# Patient Record
Sex: Male | Born: 1970 | Race: White | Hispanic: No | Marital: Married | Smoking: Former smoker
Health system: Southern US, Community
[De-identification: ages and names within clinical notes are randomized; demographics above are authoritative.]

## PROBLEM LIST (undated history)

## (undated) DIAGNOSIS — B351 Tinea unguium: Secondary | ICD-10-CM

## (undated) DIAGNOSIS — G4730 Sleep apnea, unspecified: Secondary | ICD-10-CM

## (undated) DIAGNOSIS — I10 Essential (primary) hypertension: Secondary | ICD-10-CM

## (undated) DIAGNOSIS — E785 Hyperlipidemia, unspecified: Secondary | ICD-10-CM

## (undated) DIAGNOSIS — H409 Unspecified glaucoma: Secondary | ICD-10-CM

## (undated) HISTORY — PX: INNER EAR SURGERY: SHX679

## (undated) HISTORY — PX: HERNIA REPAIR: SHX51

## (undated) HISTORY — DX: Essential (primary) hypertension: I10

## (undated) HISTORY — DX: Tinea unguium: B35.1

## (undated) HISTORY — PX: ARTHROSCOPIC REPAIR ACL: SUR80

## (undated) HISTORY — DX: Unspecified glaucoma: H40.9

## (undated) HISTORY — DX: Sleep apnea, unspecified: G47.30

## (undated) HISTORY — DX: Hyperlipidemia, unspecified: E78.5

---

## 2011-02-10 ENCOUNTER — Emergency Department (HOSPITAL_COMMUNITY)
Admission: EM | Admit: 2011-02-10 | Discharge: 2011-02-10 | Disposition: A | Discharge status: Home / Self Care | Payer: BC Managed Care – PPO | Attending: Emergency Medicine | Admitting: Emergency Medicine

## 2011-02-10 ENCOUNTER — Emergency Department (HOSPITAL_COMMUNITY): Payer: BC Managed Care – PPO

## 2011-02-10 DIAGNOSIS — R030 Elevated blood-pressure reading, without diagnosis of hypertension: Secondary | ICD-10-CM | POA: Insufficient documentation

## 2011-02-10 DIAGNOSIS — R109 Unspecified abdominal pain: Secondary | ICD-10-CM | POA: Insufficient documentation

## 2011-02-10 DIAGNOSIS — R112 Nausea with vomiting, unspecified: Secondary | ICD-10-CM | POA: Insufficient documentation

## 2011-02-10 DIAGNOSIS — N201 Calculus of ureter: Secondary | ICD-10-CM | POA: Insufficient documentation

## 2011-02-10 LAB — URINE MICROSCOPIC-ADD ON

## 2011-02-10 LAB — URINALYSIS, ROUTINE W REFLEX MICROSCOPIC
Glucose, UA: NEGATIVE mg/dL
Ketones, ur: NEGATIVE mg/dL
Nitrite: NEGATIVE
Specific Gravity, Urine: 1.026 (ref 1.005–1.030)
pH: 7.5 (ref 5.0–8.0)

## 2011-02-10 LAB — POCT I-STAT, CHEM 8
Calcium, Ion: 1.1 mmol/L — ABNORMAL LOW (ref 1.12–1.32)
Chloride: 108 mEq/L (ref 96–112)
Glucose, Bld: 102 mg/dL — ABNORMAL HIGH (ref 70–99)
HCT: 42 % (ref 39.0–52.0)
Hemoglobin: 14.3 g/dL (ref 13.0–17.0)
TCO2: 22 mmol/L (ref 0–100)

## 2011-02-12 ENCOUNTER — Ambulatory Visit (HOSPITAL_COMMUNITY)
Admission: RE | Admit: 2011-02-12 | Discharge: 2011-02-12 | Disposition: A | Discharge status: Home / Self Care | Payer: BC Managed Care – PPO | Source: Ambulatory Visit | Attending: Urology | Admitting: Urology

## 2011-02-12 ENCOUNTER — Ambulatory Visit (HOSPITAL_COMMUNITY): Payer: BC Managed Care – PPO

## 2011-02-12 DIAGNOSIS — N201 Calculus of ureter: Secondary | ICD-10-CM | POA: Insufficient documentation

## 2011-02-12 DIAGNOSIS — F17200 Nicotine dependence, unspecified, uncomplicated: Secondary | ICD-10-CM | POA: Insufficient documentation

## 2012-10-12 IMAGING — CT CT ABD-PELV W/O CM
2 of 4 series · 17 of 46 positions shown, 19 images · non-contrast
Comparison: None.

CLINICAL DATA: 39-year-old male with left side abdominal and back
pain with nausea.

CT ABDOMEN AND PELVIS WITHOUT CONTRAST
TECHNIQUE: Multidetector CT imaging of the abdomen and pelvis was
performed following the standard protocol without intravenous
contrast.

[Series 2: a/p w/o 5.0 b31f st · axial · non-contrast · 0.74mm/px · z∈[-444,-39]mm · 14 of 89 slices shown, 16 images]
[im 4/89  soft-tissue]
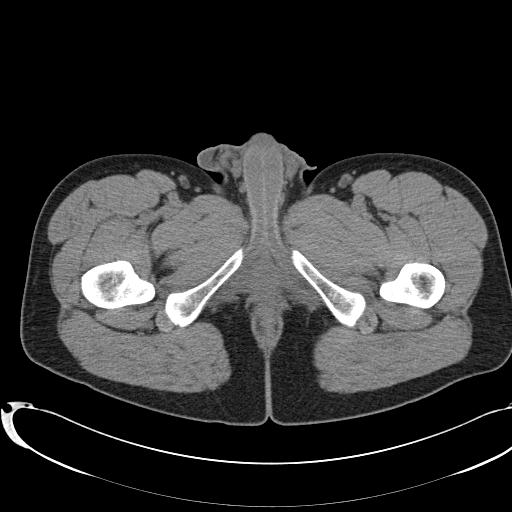
[im 4/89  bone]
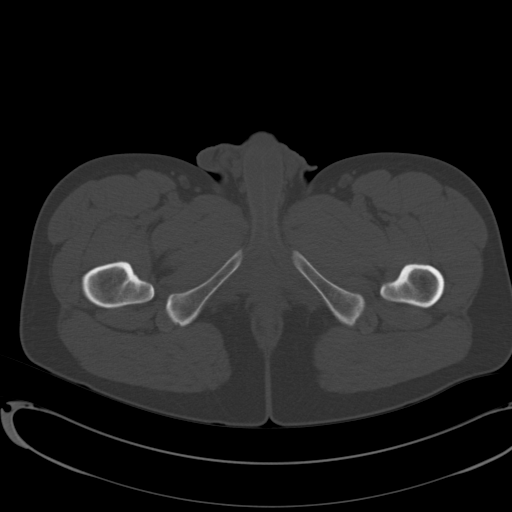
[im 11/89  soft-tissue]
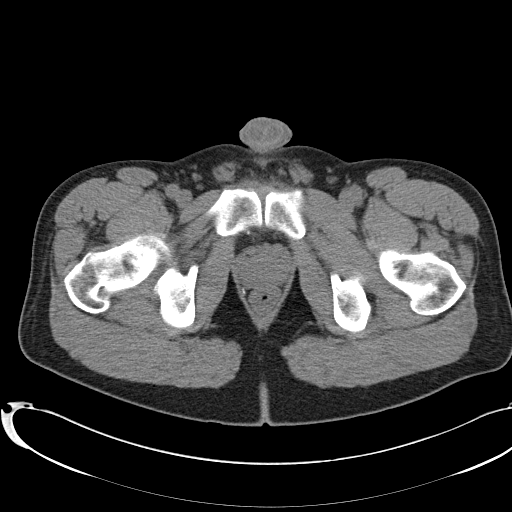
[im 18/89  soft-tissue]
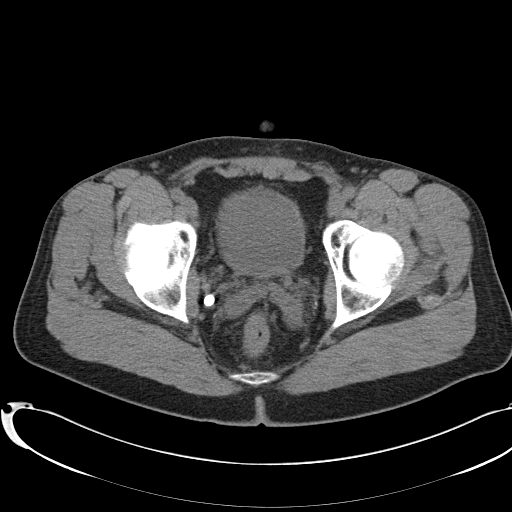
[im 25/89  soft-tissue]
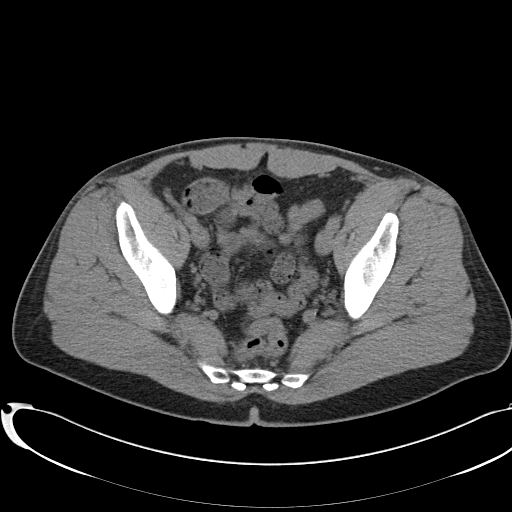
[im 29/89  soft-tissue]
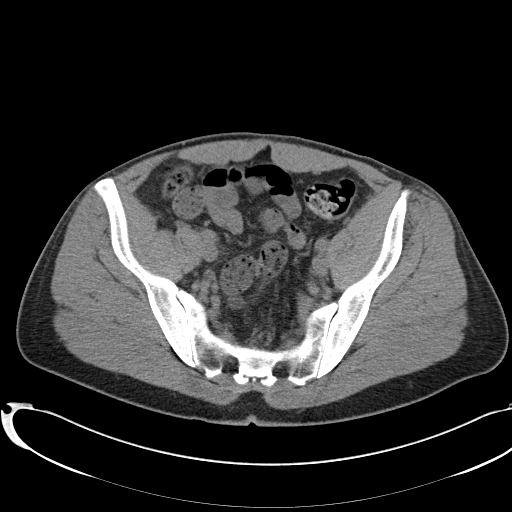
[im 36/89  soft-tissue]
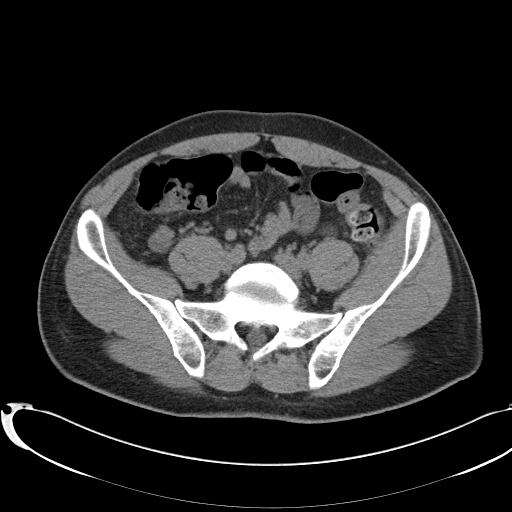
[im 43/89  soft-tissue]
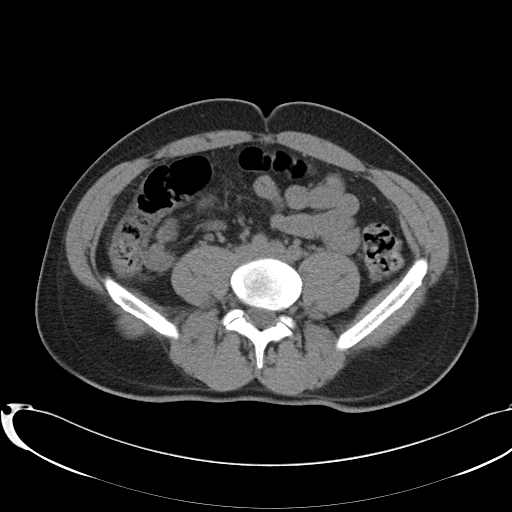
[im 46/89  soft-tissue]
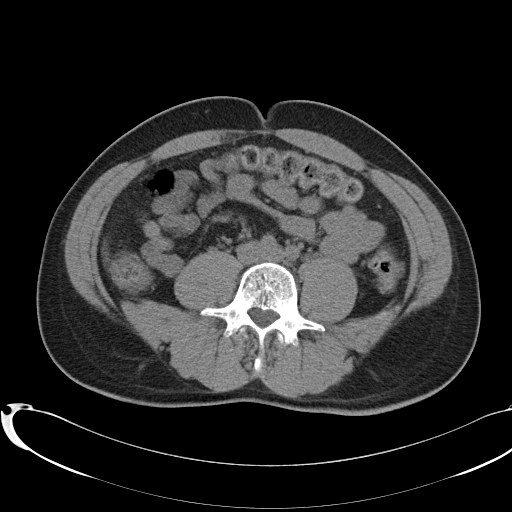
[im 53/89  soft-tissue]
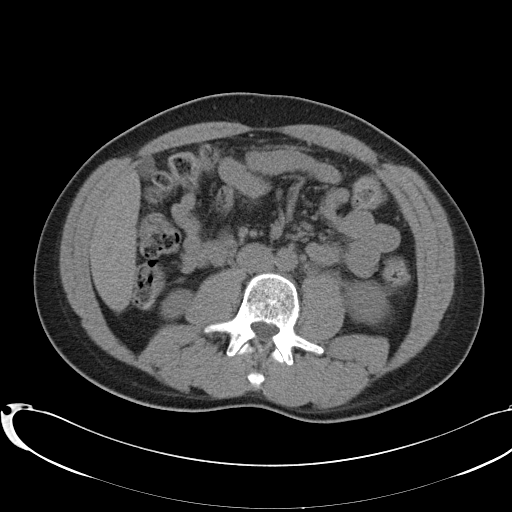
[im 53/89  bone]
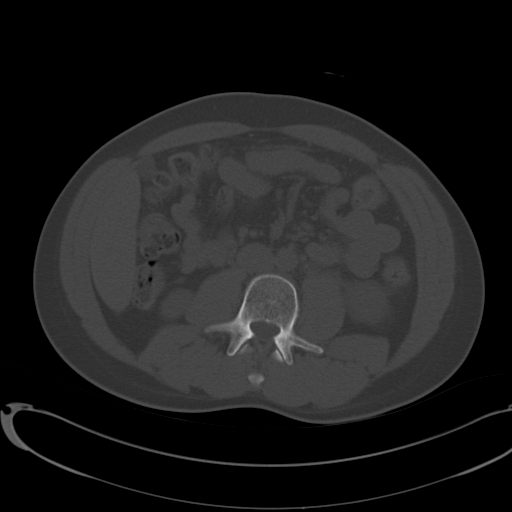
[im 60/89  soft-tissue]
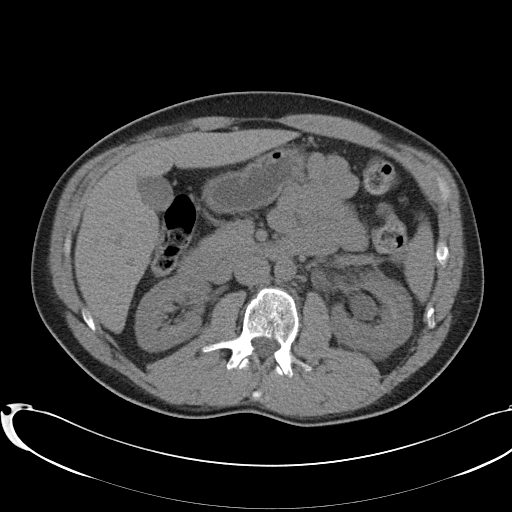
[im 67/89  soft-tissue]
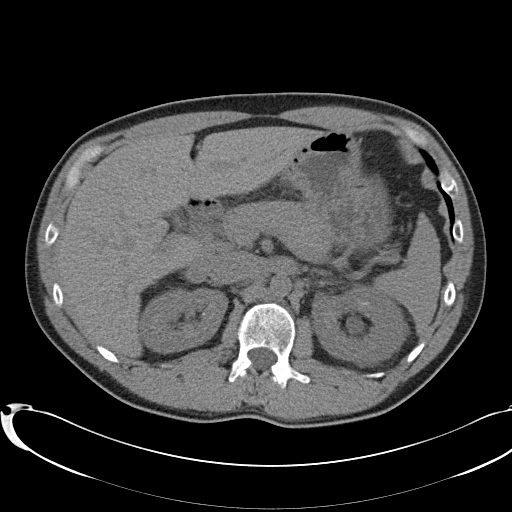
[im 71/89  soft-tissue]
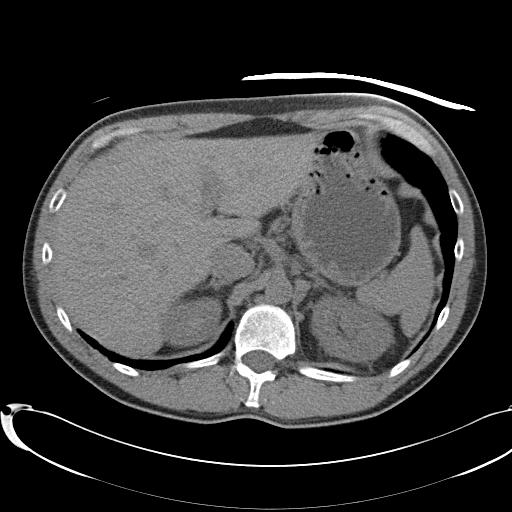
[im 78/89  soft-tissue]
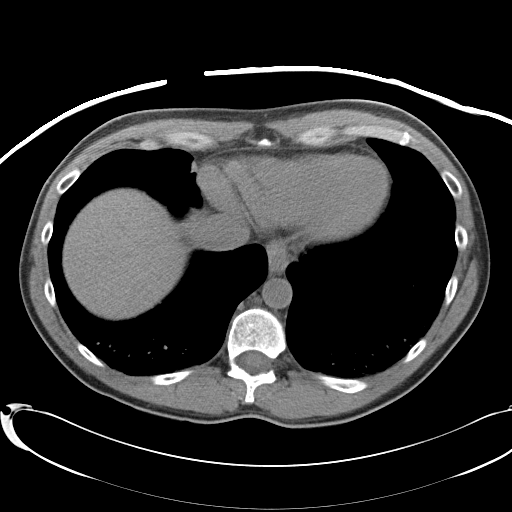
[im 85/89  soft-tissue]
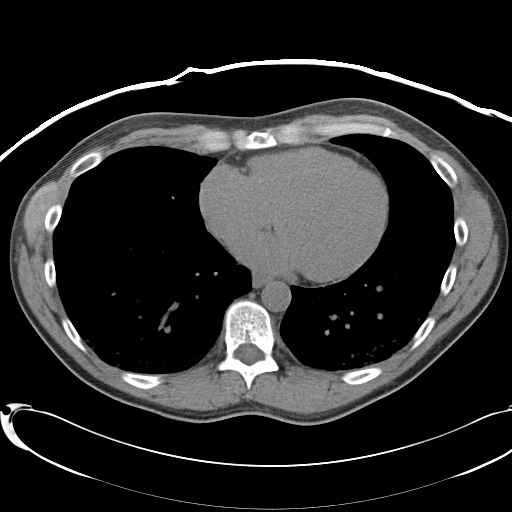

[Series 5: a/p w/o 2.0 spo cor st · coronal · non-contrast · 0.87mm/px · 3 of 99 slices shown]
[im 33/99  soft-tissue]
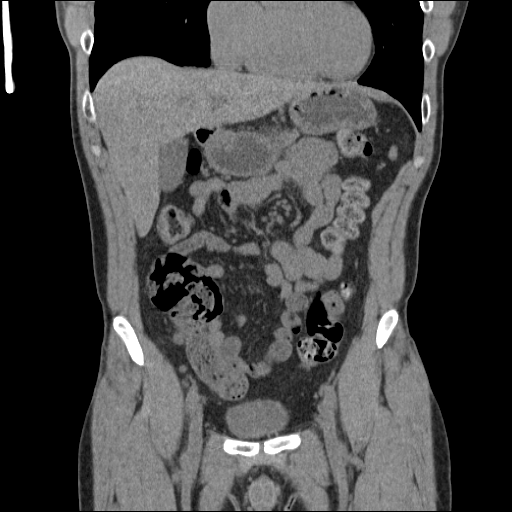
[im 44/99  soft-tissue]
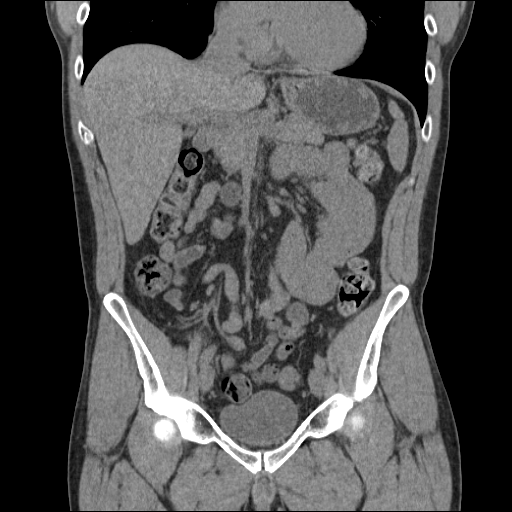
[im 55/99  soft-tissue]
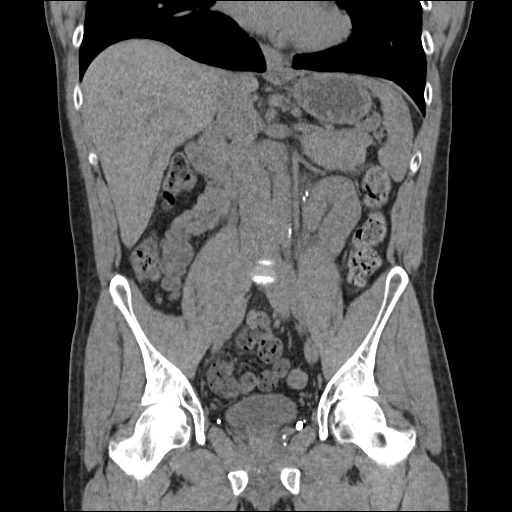

[17 of 46 positions shown; findings below may reference images not displayed]

FINDINGS: Bilateral lower lobe dependent atelectasis.  No
pericardial or pleural effusion.  Scoliosis.  Chronic bilateral L5-
S1 pars fractures with trace spondylolisthesis. No acute osseous
abnormality identified.  No pelvic free fluid.  Retained stool
throughout much of the colon.  Normal appendix.  No dilated small
bowel.  Stomach and duodenum within normal limits.  Noncontrast
liver, gallbladder, spleen, pancreas, and adrenal glands are within
normal limits.

There are two adjacent calculi lodged in the proximal left ureter 3
cm distal to the left UVJ.  The smaller calculus measures 5 x 2 mm.
The larger measures 7 x 4 mm.  There is moderate to severe left
hydronephrosis with left perinephric and periureteric stranding.
No free fluid.  There are punctate residual intrarenal calculi on
the left.  The more distal left ureter is decompressed and within
normal limits.  There are numerous pelvic phleboliths bilaterally.

Punctate right renal mid pole nephrolithiasis.  The right kidney
and ureter otherwise are normal.  The bladder is unremarkable.
IMPRESSION: 1.  Two calculi lodged in the proximal left ureter account for
acute obstructive uropathy on that side.  These are located 3 cm
from the UPJ in the large calculus measures 7 x 4 mm.
2.  Small, punctate bilateral nephrolithiasis.  Numerous pelvic
phleboliths incidentally noted.
3.  Pulmonary atelectasis.  Chronic L5-S1 pars fractures with mild
spondylolisthesis.

## 2016-12-26 DIAGNOSIS — R69 Illness, unspecified: Secondary | ICD-10-CM | POA: Diagnosis not present

## 2018-11-10 DIAGNOSIS — J019 Acute sinusitis, unspecified: Secondary | ICD-10-CM | POA: Diagnosis not present

## 2018-11-14 DIAGNOSIS — Z23 Encounter for immunization: Secondary | ICD-10-CM | POA: Diagnosis not present

## 2018-11-14 DIAGNOSIS — R0683 Snoring: Secondary | ICD-10-CM | POA: Diagnosis not present

## 2018-11-14 DIAGNOSIS — J309 Allergic rhinitis, unspecified: Secondary | ICD-10-CM | POA: Diagnosis not present

## 2018-11-14 DIAGNOSIS — B351 Tinea unguium: Secondary | ICD-10-CM | POA: Diagnosis not present

## 2018-12-14 DIAGNOSIS — J029 Acute pharyngitis, unspecified: Secondary | ICD-10-CM | POA: Diagnosis not present

## 2018-12-14 DIAGNOSIS — J069 Acute upper respiratory infection, unspecified: Secondary | ICD-10-CM | POA: Diagnosis not present

## 2018-12-14 DIAGNOSIS — Z6827 Body mass index (BMI) 27.0-27.9, adult: Secondary | ICD-10-CM | POA: Diagnosis not present

## 2019-01-03 ENCOUNTER — Ambulatory Visit: Payer: BLUE CROSS/BLUE SHIELD | Admitting: Neurology

## 2019-01-03 ENCOUNTER — Encounter: Payer: Self-pay | Admitting: Neurology

## 2019-01-03 VITALS — BP 120/79 | HR 84 | Ht 68.0 in | Wt 179.0 lb

## 2019-01-03 DIAGNOSIS — Z820 Family history of epilepsy and other diseases of the nervous system: Secondary | ICD-10-CM

## 2019-01-03 DIAGNOSIS — R0683 Snoring: Secondary | ICD-10-CM

## 2019-01-03 DIAGNOSIS — G4719 Other hypersomnia: Secondary | ICD-10-CM | POA: Diagnosis not present

## 2019-01-03 DIAGNOSIS — E663 Overweight: Secondary | ICD-10-CM | POA: Diagnosis not present

## 2019-01-03 DIAGNOSIS — R0681 Apnea, not elsewhere classified: Secondary | ICD-10-CM

## 2019-01-03 NOTE — Progress Notes (Signed)
Subjective:    Patient ID: Jacob Terrell is a 48 y.o. male.  HPI     Star Age, MD, PhD [facility] [address] [place], [place] [address]  Dear [name],   I saw your patient, Jacob Terrell, upon your kind request in the sleep clinic today for initial consultation of his sleep disorder, in particular, concern for underlying obstructive sleep apnea. The patient is unaccompanied today. As you know, Jacob Terrell is a 48 year old right-handed gentleman with an underlying medical history of allergic rhinitis, and mildly overweight state, who reports snoring and witnessed apneas, per wife's report. I reviewed your office note from 11/14/2018, which you kindly included. His Epworth sleepiness score is 13 out of 24 today, fatigue score is 31 out of 63. He does not wake up rested. He lives at home with his wife and children, he has 3 children, ages 14, 23 and 2. The 44-year-old and 26-year-old girls share a bedroom. He has a 6-year-old son who has his own bedroom. They do have a dog in the house which typically sleeps in the bed with them. They have a TV in the bedroom but it is typically not utilized, they do have a white noise machine and a monitor for the kids in their bedroom. He quit smoking in 2013, but uses nicotine vapor, he drinks alcohol about 6 drinks a week on average, caffeine in the form of coffee, about 16 ounces per day on average.his father has sleep apnea and uses a CPAP machine, his older brother has sleep apnea and uses a CPAP machine. Patient had ear tubes as a child but no tonsillectomy. Bedtime is around midnight and rise time around 6:30. He denies night to night nocturia or recurrent morning headaches. He works in [employer] in [employer] and has to drive to [place] on a regular basis. He sometimes travels along the [place] as well.  His Past Medical History Is Significant For: Past Medical History:  Diagnosis Date  . Toenail fungus     His Past Surgical History Is Significant For:   His Family History Is Significant For: No family history on file.  His Social History Is Significant For: Social History   Socioeconomic History  . Marital status: Married    Spouse name: Not on file  . Number of children: Not on file  . Years of education: Not on file  . Highest education level: Not on file  Occupational History  . Not on file  Social Needs  . Financial resource strain: Not on file  . Food insecurity:    Worry: Not on file    Inability: Not on file  . Transportation needs:    Medical: Not on file    Non-medical: Not on file  Tobacco Use  . Smoking status: Former Research scientist (life sciences)  . Smokeless tobacco: Never Used  Substance and Sexual Activity  . Alcohol use: Yes    Alcohol/week: 6.0 standard drinks    Types: 6 Standard drinks or equivalent per week  . Drug use: Not Currently  . Sexual activity: Not on file  Lifestyle  . Physical activity:    Days per week: Not on file    Minutes per session: Not on file  . Stress: Not on file  Relationships  . Social connections:    Talks on phone: Not on file    Gets together: Not on file    Attends religious service: Not on file    Active  member of club or organization: Not on file    Attends meetings of clubs or organizations: Not on file    Relationship status: Not on file  Other Topics Concern  . Not on file  Social History Narrative  . Not on file    His Allergies Are:  Allergies  Allergen Reactions  . Amoxicillin   :   His Current Medications Are:  Outpatient Encounter Medications as of 01/03/2019  Medication Sig  . terbinafine (LAMISIL) 250 MG tablet Take 250 mg by mouth daily.   No facility-administered encounter medications on file as of 01/03/2019.   :  Review of Systems:  Out of a complete 14 point review of systems, all are reviewed and negative with the exception of these symptoms as listed below: Review of Systems  Neurological:       Pt  presents today to discuss his sleep. Pt has never had a sleep study but does endorse snoring.  Epworth Sleepiness Scale 0= would never doze 1= slight chance of dozing 2= moderate chance of dozing 3= high chance of dozing  Sitting and reading: 2 Watching TV: 3 Sitting inactive in a public place (ex. Theater or meeting): 1 As a passenger in a car for an hour without a break: 2 Lying down to rest in the afternoon: 3 Sitting and talking to someone: 0 Sitting quietly after lunch (no alcohol): 2 In a car, while stopped in traffic: 0 Total: 13     Objective:  Neurological Exam  Physical Exam Physical Examination:   Vitals:   01/03/19 1321  BP: 120/79  Pulse: 84    General Examination: The patient is a very pleasant 48 y.o. male in no acute distress. He appears well-developed and well-nourished and well groomed.   HEENT: Normocephalic, atraumatic, pupils are equal, round and reactive to light and accommodation. Extraocular tracking is good without limitation to gaze excursion or nystagmus noted. Normal smooth pursuit is noted. Hearing is grossly intact. Tympanic membranes are clear bilaterally. Face is symmetric with normal facial animation and normal facial sensation. Speech is clear with no dysarthria noted. There is no hypophonia. There is no lip, neck/head, jaw or voice tremor. Neck is supple with full range of passive and active motion. There are no carotid bruits on auscultation. Oropharynx exam reveals: mild mouth dryness, good dental hygiene and moderate airway crowding, due to smaller airway entry, slightly larger appearing uvula and tonsils in place, about 1+ bilaterally. Mallampati is class II. Tongue protrudes centrally and palate elevates symmetrically. Neck circumference is 15 inches, he has a minimal overbite.   Chest: Clear to auscultation without wheezing, rhonchi or crackles noted.  Heart: S1+S2+0, regular and normal without murmurs, rubs or gallops noted.   Abdomen:  Soft, non-tender and non-distended with normal bowel sounds appreciated on auscultation.  Extremities: There is no pitting edema in the distal lower extremities bilaterally. Pedal pulses are intact.  Skin: Warm and dry without trophic changes noted.  Musculoskeletal: exam reveals no obvious joint deformities, tenderness or joint swelling or erythema.   Neurologically:  Mental status: The patient is awake, alert and oriented in all 4 spheres. Her immediate and remote memory, attention, language skills and fund of knowledge are appropriate. There is no evidence of aphasia, agnosia, apraxia or anomia. Speech is clear with normal prosody and enunciation. Thought process is linear. Mood is normal and affect is normal.  Cranial nerves II - XII are as described above under HEENT exam. In addition: shoulder shrug is normal  with equal shoulder height noted. Motor exam: Normal bulk, strength and tone is noted. There is no tremor. Romberg is negative. Fine motor skills and coordination: grossly intact.  Cerebellar testing: No dysmetria or intention tremor on finger to nose testing. Heel to shin is unremarkable bilaterally. There is no truncal or gait ataxia.  Sensory exam: intact to light touch in the upper and lower extremities.  Gait, station and balance: He stands easily. No veering to one side is noted. No leaning to one side is noted. Posture is age-appropriate and stance is narrow based. Gait shows normal stride length and normal pace. No problems turning are noted. Tandem walk is unremarkable. Intact toe and heel stance is noted.               Assessment and Plan:   In summary, Jacob Terrell is a very pleasant 48 y.o.-year old male with an underlying medical history of allergic rhinitis, and mildly overweight state, whose history and physical exam are concerning for obstructive sleep apnea (OSA). I had a long chat with the patient about my findings and the diagnosis of OSA, its prognosis and  treatment options. We talked about medical treatments, surgical interventions and non-pharmacological approaches. I explained in particular the risks and ramifications of untreated moderate to severe OSA, especially with respect to developing cardiovascular disease down the Road, including congestive heart failure, difficult to treat hypertension, cardiac arrhythmias, or stroke. Even type 2 diabetes has, in part, been linked to untreated OSA. Symptoms of untreated OSA include daytime sleepiness, memory problems, mood irritability and mood disorder such as depression and anxiety, lack of energy, as well as recurrent headaches, especially morning headaches. We talked about trying to maintain a healthy lifestyle in general, as well as the importance of weight control. I encouraged the patient to eat healthy, exercise daily and keep well hydrated, to keep a scheduled bedtime and wake time routine, to not skip any meals and eat healthy snacks in between meals. I advised the patient not to drive when feeling sleepy. I recommended the following at this time: sleep study with potential positive airway pressure titration. (We will score hypopneas at 3%).   I explained the sleep test procedure to the patient and also outlined possible surgical and non-surgical treatment options of OSA, including the use of a custom-made dental device (which would require a referral to a specialist dentist or oral surgeon), upper airway surgical options, such as pillar implants, radiofrequency surgery, tongue base surgery, and UPPP (which would involve a referral to an ENT surgeon) and Inspire implantable device. Rarely, jaw surgery such as mandibular advancement may be considered.  I also explained the CPAP treatment option to the patient, who indicated that he would be willing to try CPAP if the need arises. I explained the importance of being compliant with PAP treatment, not only for insurance purposes but primarily to improve His  symptoms, and for the patient's long term health benefit, including to reduce His cardiovascular risks. I answered all his questions today and the patient was in agreement. I plan to see him back after the sleep study is completed and encouraged him to call with any interim questions, concerns, problems or updates.   Thank you very much for allowing me to participate in the care of this nice patient. If I can be of any further assistance to you please do not hesitate to call me at [phone].  Sincerely,   Star Age, MD, PhD

## 2019-01-03 NOTE — Patient Instructions (Signed)

## 2019-01-30 ENCOUNTER — Telehealth: Payer: Self-pay | Admitting: Neurology

## 2019-01-30 NOTE — Telephone Encounter (Signed)
Pt is returning Nicks call.

## 2019-02-17 DIAGNOSIS — J309 Allergic rhinitis, unspecified: Secondary | ICD-10-CM | POA: Diagnosis not present

## 2019-02-17 DIAGNOSIS — B351 Tinea unguium: Secondary | ICD-10-CM | POA: Diagnosis not present

## 2019-02-17 DIAGNOSIS — R030 Elevated blood-pressure reading, without diagnosis of hypertension: Secondary | ICD-10-CM | POA: Diagnosis not present

## 2019-02-17 DIAGNOSIS — R0683 Snoring: Secondary | ICD-10-CM | POA: Diagnosis not present

## 2019-05-25 ENCOUNTER — Ambulatory Visit: Payer: BC Managed Care – PPO | Admitting: Podiatry

## 2019-05-25 ENCOUNTER — Encounter: Payer: Self-pay | Admitting: Podiatry

## 2019-05-25 ENCOUNTER — Other Ambulatory Visit: Payer: Self-pay

## 2019-05-25 VITALS — BP 131/79

## 2019-05-25 DIAGNOSIS — B351 Tinea unguium: Secondary | ICD-10-CM

## 2019-05-25 DIAGNOSIS — M79676 Pain in unspecified toe(s): Secondary | ICD-10-CM | POA: Diagnosis not present

## 2019-05-25 DIAGNOSIS — L600 Ingrowing nail: Secondary | ICD-10-CM | POA: Diagnosis not present

## 2019-05-25 MED ORDER — NEOMYCIN-POLYMYXIN-HC 3.5-10000-1 OT SOLN: OTIC | 1 refills | Status: DC

## 2019-05-25 MED ORDER — FLUCONAZOLE 150 MG PO TABS: 150.0000 mg | ORAL_TABLET | ORAL | 1 refills | Status: DC

## 2019-05-25 NOTE — Patient Instructions (Signed)

## 2019-06-09 ENCOUNTER — Ambulatory Visit: Payer: BC Managed Care – PPO | Admitting: Podiatry

## 2019-06-09 ENCOUNTER — Other Ambulatory Visit: Payer: Self-pay

## 2019-06-09 VITALS — Temp 97.2°F

## 2019-06-09 DIAGNOSIS — B351 Tinea unguium: Secondary | ICD-10-CM | POA: Diagnosis not present

## 2019-06-09 DIAGNOSIS — L600 Ingrowing nail: Secondary | ICD-10-CM

## 2019-06-09 DIAGNOSIS — M79676 Pain in unspecified toe(s): Secondary | ICD-10-CM | POA: Diagnosis not present

## 2019-06-09 NOTE — Progress Notes (Signed)
  Subjective:  Patient ID: Jacob Terrell, male    DOB: [birth year],  MRN: [id number]  Chief Complaint  Patient presents with  . Nail Problem    Right 1st medial nail check. Pt states very little drainage, some redness, no pain. Pt unsure if he can start his Fluconazole.   48 y.o. male returns for the above complaint. Hx as above.  Objective:   General AA&O x3. Normal mood and affect.  Vascular Foot warm and well perfused with good capillary refill.  Neurologic Sensation grossly intact.  Dermatologic Nail avulsion site healing well without drainage or erythema. Nail bed with overlying soft crust. Left intact. No signs of local infection.  Orthopedic: No tenderness to palpation of the toe.   Assessment & Plan:  Patient was evaluated and treated and all questions answered.  S/p Ingrown Toenail Excision, right -Healing well without issue. -Discussed return precautions. -F/u PRN  Onyhcomycosis -Start Fluconazole

## 2019-06-11 NOTE — Progress Notes (Signed)
  Subjective:  Patient ID: Jacob Terrell, male    DOB: [birth year],  MRN: [id number]  Chief Complaint  Patient presents with  . Ingrown Toenail    right great toenail medial side, going on for 2 weeks, pain is a shooting pain, pain elevated with pressure    48 y.o. male presents with the above complaint. Hx as above  Also would like to discuss toenail fungus.  Has been on Lamisil previously but is concerned about continuing the medicine.   Review of Systems: Negative except as noted in the HPI. Denies N/V/F/Ch.  Past Medical History:  Diagnosis Date  . Toenail fungus     Current Outpatient Medications:  .  terbinafine (LAMISIL) 250 MG tablet, Take 250 mg by mouth daily., Disp: , Rfl:  .  fluconazole (DIFLUCAN) 150 MG tablet, Take 1 tablet (150 mg total) by mouth once a week. (Patient not taking: Reported on 06/09/2019), Disp: 6 tablet, Rfl: 1 .  neomycin-polymyxin-hydrocortisone (CORTISPORIN) OTIC solution, Apply 1-2 drops to toe BID after soaking, Disp: 10 mL, Rfl: 1  Social History   Tobacco Use  Smoking Status Former Smoker  Smokeless Tobacco Never Used    Allergies  Allergen Reactions  . Amoxicillin    Objective:   Vitals:   05/25/19 1550  BP: 131/79   There is no height or weight on file to calculate BMI. Constitutional Well developed. Well nourished.  Vascular Dorsalis pedis pulses palpable bilaterally. Posterior tibial pulses palpable bilaterally. Capillary refill normal to all digits.  No cyanosis or clubbing noted. Pedal hair growth normal.  Neurologic Normal speech. Oriented to person, place, and time. Epicritic sensation to light touch grossly present bilaterally.  Dermatologic Painful ingrowing nail at medial nail borders of the hallux nail left. No other open wounds. No skin lesions.  Orthopedic: Normal joint ROM without pain or crepitus bilaterally. No visible deformities. No bony tenderness.   Radiographs: None Assessment:   1. Ingrown nail    2. Pain around toenail   3. Onychomycosis    Plan:  Patient was evaluated and treated and all questions answered.  Ingrown Nail, left -Patient elects to proceed with minor surgery to remove ingrown toenail removal today. Consent reviewed and signed by patient. -Ingrown nail excised. See procedure note. -Educated on post-procedure care including soaking. Written instructions provided and reviewed. -Patient to follow up in 2 weeks for nail check.  Procedure: Excision of Ingrown Toenail Location: Left 1st medial nail borders. Anesthesia: Lidocaine 1% plain; 1.5 mL and Marcaine 0.5% plain; 1.5 mL, digital block. Skin Prep: Betadine. Dressing: Silvadene; telfa; dry, sterile, compression dressing. Technique: Following skin prep, the toe was exsanguinated and a tourniquet was secured at the base of the toe. The affected nail border was freed, split with a nail splitter, and excised. Chemical matrixectomy was then performed with phenol and irrigated out with alcohol. The tourniquet was then removed and sterile dressing applied. Disposition: Patient tolerated procedure well. Patient to return in 2 weeks for follow-up.   Onychomycosis -Educated on etiology of nail fungus. -eRx for oral terbinafine #30. Educated on risks and benefits of the medication.     No follow-ups on file.

## 2019-07-18 DIAGNOSIS — Z1159 Encounter for screening for other viral diseases: Secondary | ICD-10-CM | POA: Diagnosis not present

## 2019-07-21 DIAGNOSIS — Z125 Encounter for screening for malignant neoplasm of prostate: Secondary | ICD-10-CM | POA: Diagnosis not present

## 2019-07-21 DIAGNOSIS — E785 Hyperlipidemia, unspecified: Secondary | ICD-10-CM | POA: Diagnosis not present

## 2019-07-21 DIAGNOSIS — Z0000 Encounter for general adult medical examination without abnormal findings: Secondary | ICD-10-CM | POA: Diagnosis not present

## 2019-08-04 DIAGNOSIS — Z1159 Encounter for screening for other viral diseases: Secondary | ICD-10-CM | POA: Diagnosis not present

## 2019-08-08 DIAGNOSIS — M549 Dorsalgia, unspecified: Secondary | ICD-10-CM | POA: Diagnosis not present

## 2019-08-08 DIAGNOSIS — Z23 Encounter for immunization: Secondary | ICD-10-CM | POA: Diagnosis not present

## 2019-08-08 DIAGNOSIS — Z6827 Body mass index (BMI) 27.0-27.9, adult: Secondary | ICD-10-CM | POA: Diagnosis not present

## 2019-08-08 DIAGNOSIS — Z0000 Encounter for general adult medical examination without abnormal findings: Secondary | ICD-10-CM | POA: Diagnosis not present

## 2019-08-10 ENCOUNTER — Ambulatory Visit: Payer: BC Managed Care – PPO | Admitting: Podiatry

## 2019-08-10 ENCOUNTER — Other Ambulatory Visit: Payer: Self-pay

## 2019-08-10 DIAGNOSIS — B351 Tinea unguium: Secondary | ICD-10-CM

## 2019-08-10 DIAGNOSIS — L600 Ingrowing nail: Secondary | ICD-10-CM | POA: Diagnosis not present

## 2019-08-10 MED ORDER — FLUCONAZOLE 150 MG PO TABS: 150.0000 mg | ORAL_TABLET | ORAL | 1 refills | Status: DC

## 2019-08-10 NOTE — Progress Notes (Signed)
  Subjective:  Patient ID: NARVELL TOLLIS, male    DOB: 26-Jan-1971,  MRN: [id number]  Chief Complaint  Patient presents with  . Nail Problem    pt is here for a toenail fungus, f/u, pt states that his nail might be growing back abnormal, pt is also concerned about the toenail fungus not growing back completely   48 y.o. male returns for the above complaint. Hx as above.  Objective:   General AA&O x3. Normal mood and affect.  Vascular Foot warm and well perfused with good capillary refill.  Neurologic Sensation grossly intact.  Dermatologic Nail avulsion site healing well without drainage or erythema. Nail bed with overlying soft crust. Left intact. No signs of local infection. Remaining nails with distal thickening yellow discoloration  Orthopedic: No tenderness to palpation of the toe.   Assessment & Plan:  Patient was evaluated and treated and all questions answered.  Status post ingrown toenail removal right -Only slight hyperkeratosis.  Gently debrided  Onychomycosis -Improving refill fluconazole

## 2019-08-23 ENCOUNTER — Ambulatory Visit (INDEPENDENT_AMBULATORY_CARE_PROVIDER_SITE_OTHER): Payer: BC Managed Care – PPO | Admitting: Neurology

## 2019-08-23 DIAGNOSIS — E663 Overweight: Secondary | ICD-10-CM

## 2019-08-23 DIAGNOSIS — G4733 Obstructive sleep apnea (adult) (pediatric): Secondary | ICD-10-CM

## 2019-08-23 DIAGNOSIS — Z820 Family history of epilepsy and other diseases of the nervous system: Secondary | ICD-10-CM

## 2019-08-23 DIAGNOSIS — G4719 Other hypersomnia: Secondary | ICD-10-CM

## 2019-08-23 DIAGNOSIS — R0681 Apnea, not elsewhere classified: Secondary | ICD-10-CM

## 2019-08-23 DIAGNOSIS — R0683 Snoring: Secondary | ICD-10-CM

## 2019-09-01 NOTE — Progress Notes (Signed)
Patient referred by Ilona Sorrel, PA with Dr. Ival Bible office, seen by me on 01/03/19, HST on 08/24/19.    Please call and notify the patient that the recent home sleep test showed obstructive sleep apnea in the moderate range. While I recommend treatment for this in the form CPAP, his insurance will not approve a sleep study for this. They will likely only approve a trial of autoPAP, which means, that we don't have to bring him in for a sleep study with CPAP, but will let him try an autoPAP machine at home, through a DME company (of his choice, or as per insurance requirement). The DME representative will educate him on how to use the machine, how to put the mask on, etc. I have placed an order in the chart. Please send referral, talk to patient, send report to referring MD. We will need a FU in sleep clinic for 10 weeks post-PAP set up, please arrange that with me or one of our NPs. Thanks,   Star Age, MD, PhD [facility])

## 2019-09-01 NOTE — Addendum Note (Signed)
Addended by: Star Age on: 09/01/2019 12:32 PM   Modules accepted: Orders

## 2019-09-01 NOTE — Procedures (Signed)
Patient Information     First Name: Jacob Last Name: Terrell ID: [id number]  Birth Date: [birth year] Age: 48 Gender: Male  Referring Provider: Lutricia Feil, PA BMI: 27.22kg/m2 (65"/179lb)   Neck Circ.:  15 '' Epworth:  13/24  Sleep Study Information     Study Date: Aug 24, 2019 S/H/A Version: 001.001.001.001 / 4.1.1528 / 12  History:    48 year old man with a history of allergic rhinitis, and mildly overweight state, who reports snoring and witnessed apneas.   Summary & Diagnosis:    OSA Recommendations:     This home sleep test demonstrates moderate obstructive sleep apnea with a total AHI of 23.8/hour and O2 nadir of 83%. Given the patient's medical history and sleep related complaints, treatment with positive airway pressure (in the form of CPAP) is recommended. This will require a full night CPAP titration study for proper treatment settings, O2 monitoring and mask fitting. Based on the severity of the sleep disordered breathing an attended titration study is indicated. However, patient's insurance has denied an attended sleep study; therefore, the patient will be advised to proceed with an autoPAP titration/trial at home for now. Please note that untreated obstructive sleep apnea may carry additional perioperative morbidity. Patients with significant obstructive sleep apnea should receive perioperative PAP therapy and the surgeons and particularly the anesthesiologist should be informed of the diagnosis and the severity of the sleep disordered breathing. The patient should be cautioned not to drive, work at heights, or operate dangerous or heavy equipment when tired or sleepy. Review and reiteration of good sleep hygiene measures should be pursued with any patient. Other causes of the patient's symptoms, including circadian rhythm disturbances, an underlying mood disorder, medication effect and/or an underlying medical problem cannot be ruled out based on this test. Clinical correlation  is recommended. The patient and his referring provider will be notified of the test results. The patient will be seen in follow up in sleep clinic at [facility].  I certify that I have reviewed the raw data recording prior to the issuance of this report in accordance with the standards of the American Academy of Sleep Medicine (AASM).  Star Age, MD, PhD [facility]) Diplomat, ABPN (Neurology and Sleep)             Sleep Summary  Oxygen Saturation Statistics   Start Study Time: End Study Time: Total Recording Time:  2:50:56 AM 8:10:17 AM 5 h, 19 min  Total Sleep Time % REM of Sleep Time:  3 h, 56 min 31.1    Mean: 93 Minimum: 83 Maximum: 99  Mean of Desaturations Nadirs (%):   90  Oxygen Desaturation. %:   4-9 10-20 >20 Total  Events Number Total    53  5 91.4 8.6  0 0.0  58 100.0  Oxygen Saturation: <90 <=88 <85 <80 <70  Duration (minutes): Sleep % 4.9 2.1  2.3 0.3  1.0 0.1 0.0 0.0 0.0 0.0     Respiratory Indices      Total Events REM NREM All Night  pRDI:  95  pAHI:  93 ODI:  58  pAHIc:  0 % CSR: 0.0 45.6 44.7 33.1 0.0 14.8 14.4 6.7 0.0 24.3 23.8 14.8 0.0       Pulse Rate Statistics during Sleep (BPM)      Mean: 61 Minimum: 45 Maximum: 97    Indices are calculated using technically valid sleep time of  3 hrs, 54 min. pRDI/pAHI are  calculated using oxi desaturations ? 3%    Body Position Statistics  Position Supine Prone Right Left Non-Supine  Sleep (min) 154.0 59.4 16.0 7.0 82.4  Sleep % 65.2 25.1 6.8 3.0 34.8  pRDI 28.3 20.3 11.4 N/A 16.9  pAHI 27.5 20.3 11.4 N/A 16.9  ODI 18.8 7.1 11.4 N/A 7.3     Snoring Statistics Snoring Level (dB) >40 >50 >60 >70 >80 >Threshold (45)  Sleep (min) 131.6 4.2 1.1 0.0 0.0 10.3  Sleep % 55.7 1.8 0.5 0.0 0.0 4.4    Mean: 42 dB Sleep Stages Chart                                    pAHI=23.8                                                Mild              Moderate                     Severe                                                 5              15                    30

## 2019-09-04 ENCOUNTER — Telehealth: Payer: Self-pay

## 2019-09-04 NOTE — Telephone Encounter (Signed)
I called pt to discuss his sleep study results. No answer, left a message asking him to call me back. 

## 2019-09-04 NOTE — Telephone Encounter (Signed)
-----   Message from Star Age, MD sent at 09/01/2019 12:31 PM EDT ----- Patient referred by Ilona Sorrel, PA with Dr. Ival Bible office, seen by me on 01/03/19, HST on 08/24/19.    Please call and notify the patient that the recent home sleep test showed obstructive sleep apnea in the moderate range. While I recommend treatment for this in the form CPAP, his insurance will not approve a sleep study for this. They will likely only approve a trial of autoPAP, which means, that we don't have to bring him in for a sleep study with CPAP, but will let him try an autoPAP machine at home, through a DME company (of his choice, or as per insurance requirement). The DME representative will educate him on how to use the machine, how to put the mask on, etc. I have placed an order in the chart. Please send referral, talk to patient, send report to referring MD. We will need a FU in sleep clinic for 10 weeks post-PAP set up, please arrange that with me or one of our NPs. Thanks,   Star Age, MD, PhD [facility])

## 2019-09-05 NOTE — Telephone Encounter (Signed)
Pt returned my call. I advised pt that Dr. Rexene Alberts reviewed their sleep study results and found that pt has moderate osa. Dr. Rexene Alberts recommends that pt start an auto pap at home since pt's insurance will likely deny an in-lab titration study. I reviewed PAP compliance expectations with the pt. Pt is agreeable to starting an auto-PAP. I advised pt that an order will be sent to a DME, [facility], and [facility] will call the pt within about one week after they file with the pt's insurance. [facility] will show the pt how to use the machine, fit for masks, and troubleshoot the auto-PAP if needed. A follow up appt was made for insurance purposes with Amy, NP on 11/28/19 at 3:00pm. Pt verbalized understanding to arrive 15 minutes early and bring their auto-PAP. A letter with all of this information in it will be mailed to the pt as a reminder. I verified with the pt that the address we have on file is correct. Pt verbalized understanding of results. Pt had no questions at this time but was encouraged to call back if questions arise. I have sent the order to [facility] and have received confirmation that they have received the order.

## 2019-09-05 NOTE — Telephone Encounter (Signed)
I called pt again. No answer, left a message asking him to call me back. 

## 2019-09-05 NOTE — Telephone Encounter (Signed)
Pt returned call. Please call back when available. 

## 2019-09-07 DIAGNOSIS — Z23 Encounter for immunization: Secondary | ICD-10-CM | POA: Diagnosis not present

## 2019-09-14 ENCOUNTER — Ambulatory Visit: Payer: BC Managed Care – PPO | Admitting: Podiatry

## 2019-09-14 ENCOUNTER — Other Ambulatory Visit: Payer: Self-pay

## 2019-09-14 DIAGNOSIS — L600 Ingrowing nail: Secondary | ICD-10-CM | POA: Diagnosis not present

## 2019-09-14 DIAGNOSIS — B351 Tinea unguium: Secondary | ICD-10-CM | POA: Diagnosis not present

## 2019-09-14 NOTE — Progress Notes (Signed)
  Subjective:  Patient ID: Jacob Terrell, male    DOB: [birth year],  MRN: [id number]  Chief Complaint  Patient presents with  . Nail Problem    Pt states bilateral nail fungus is improving with Fluconazole. Pt denies any side effects with his medication.    48 y.o. male returns for the above complaint. Hx as above.  Objective:   General AA&O x3. Normal mood and affect.  Vascular Foot warm and well perfused with good capillary refill.  Neurologic Sensation grossly intact.  Dermatologic  nail avulsion site well-healed.  Distal nail with thickening discoloration slight pitting  Orthopedic:  No tenderness palpation   Assessment & Plan:  Patient was evaluated and treated and all questions answered.  Onychomycosis -Nails improving.  Continue fluconazole.  No need for refill at this point.  Discussed with patient he would likely be on it until the nail completely resolved.  He does think that they are improving.  Follow-up in 2 months for recheck.  I will be happy to refill fluconazole during that time if needed.

## 2019-10-11 DIAGNOSIS — G4733 Obstructive sleep apnea (adult) (pediatric): Secondary | ICD-10-CM | POA: Diagnosis not present

## 2019-10-18 ENCOUNTER — Other Ambulatory Visit: Payer: Self-pay

## 2019-10-18 DIAGNOSIS — Z20822 Contact with and (suspected) exposure to covid-19: Secondary | ICD-10-CM

## 2019-10-19 LAB — NOVEL CORONAVIRUS, NAA: SARS-CoV-2, NAA: NOT DETECTED

## 2019-10-20 DIAGNOSIS — H401131 Primary open-angle glaucoma, bilateral, mild stage: Secondary | ICD-10-CM | POA: Diagnosis not present

## 2019-11-11 DIAGNOSIS — G4733 Obstructive sleep apnea (adult) (pediatric): Secondary | ICD-10-CM | POA: Diagnosis not present

## 2019-11-16 ENCOUNTER — Ambulatory Visit (INDEPENDENT_AMBULATORY_CARE_PROVIDER_SITE_OTHER): Payer: BC Managed Care – PPO | Admitting: Podiatry

## 2019-11-16 ENCOUNTER — Other Ambulatory Visit: Payer: Self-pay

## 2019-11-16 DIAGNOSIS — L603 Nail dystrophy: Secondary | ICD-10-CM | POA: Diagnosis not present

## 2019-11-16 DIAGNOSIS — B351 Tinea unguium: Secondary | ICD-10-CM

## 2019-11-16 MED ORDER — FLUCONAZOLE 150 MG PO TABS: 150.0000 mg | ORAL_TABLET | ORAL | 1 refills | Status: DC

## 2019-11-16 NOTE — Progress Notes (Signed)
  Subjective:  Patient ID: Jacob Terrell, male    DOB: [birth year],  MRN: [id number]  Chief Complaint  Patient presents with  . Nail Problem    pt is here for a toenail fungus check, pt states that the fluconazole is improving, but there is still some discoloration in the toenails    49 y.o. male presents with the above complaint. History confirmed with patient.   Objective:  Physical Exam: warm, good capillary refill, nail exam onychomycosis of the left hallux, left fourth toe, right fifth toe with proximal clearing noted, no trophic changes or ulcerative lesions, normal DP and PT pulses and normal sensory exam. Assessment:   1. Onychomycosis   2. Nail dystrophy      Plan:  Patient was evaluated and treated and all questions answered.  Onychomycosis and Onychodystrophy -Discussed with patient that I do think that the nails are improving with fluconazole.  I do think that thickening of the nails of the right fifth toe and left fourth toe are due to possible traumatic changes and may likely not completely subside with the fluconazole.  We will continue the fluconazole for 2 more months as the nails continue to grow out.  Follow-up should he not be happy with the progress of the nails.  Return if symptoms worsen or fail to improve.   MDM Number of Diagnoses or Management Options Risk of Complications, Morbidity, and/or Mortality Presenting problems: minimal Management options: low

## 2019-11-21 DIAGNOSIS — E782 Mixed hyperlipidemia: Secondary | ICD-10-CM | POA: Diagnosis not present

## 2019-11-23 DIAGNOSIS — M549 Dorsalgia, unspecified: Secondary | ICD-10-CM | POA: Diagnosis not present

## 2019-11-23 DIAGNOSIS — H409 Unspecified glaucoma: Secondary | ICD-10-CM | POA: Diagnosis not present

## 2019-11-23 DIAGNOSIS — Z6825 Body mass index (BMI) 25.0-25.9, adult: Secondary | ICD-10-CM | POA: Diagnosis not present

## 2019-11-23 DIAGNOSIS — E782 Mixed hyperlipidemia: Secondary | ICD-10-CM | POA: Diagnosis not present

## 2019-11-24 DIAGNOSIS — H401131 Primary open-angle glaucoma, bilateral, mild stage: Secondary | ICD-10-CM | POA: Diagnosis not present

## 2019-11-25 ENCOUNTER — Other Ambulatory Visit: Payer: Self-pay | Admitting: Podiatry

## 2019-11-28 ENCOUNTER — Other Ambulatory Visit: Payer: Self-pay

## 2019-11-28 ENCOUNTER — Encounter: Payer: Self-pay | Admitting: Family Medicine

## 2019-11-28 ENCOUNTER — Ambulatory Visit: Payer: BC Managed Care – PPO | Admitting: Family Medicine

## 2019-11-28 VITALS — BP 137/91 | HR 89 | Temp 97.0°F | Ht 68.0 in | Wt 176.4 lb

## 2019-11-28 DIAGNOSIS — G4733 Obstructive sleep apnea (adult) (pediatric): Secondary | ICD-10-CM | POA: Diagnosis not present

## 2019-11-28 DIAGNOSIS — Z9989 Dependence on other enabling machines and devices: Secondary | ICD-10-CM | POA: Diagnosis not present

## 2019-11-28 NOTE — Progress Notes (Addendum)
PATIENT: Jacob Terrell DOB: [birth year]  REASON FOR VISIT: follow up HISTORY FROM: patient  Chief Complaint  Patient presents with  . Follow-up    Initial autopap f/u. Alone. Rm 2. No new concerns at this time.      HISTORY OF PRESENT ILLNESS: Today 11/28/19 Jacob Terrell is a 48 y.o. male here today for follow up for OSA on CPAP. HST in 08/2019 revealed moderate OSA with AHI of 23.8 and O2 nadir of 83%. He returns today fr initial CPAP compliance review.  He reports doing very well on CPAP therapy.  He is using CPAP nightly.  He has noted significant improvements in sleep quality and energy levels.  He does admit that he will sometimes drift off to sleep on the couch and not get the full 4-hours recommended. He denies any concerns today.  Compliance report dated 10/28/2019 through 11/26/2019 reveals that he used CPAP 30 of the last 30 days for compliance 100%.  He used CPAP greater than 4 hours 24 of the last 30 days for compliance of 80%.  Average usage was 5 hours and 20 minutes.  Residual AHI was 1.3 on 7 to 13 cm of water and an EPR of 3.  There was no significant leak noted.  HISTORY: (copied from Dr Jacob Terrell note on 01/03/2019)  Dear Jacob Terrell,   I saw your patient, Jacob Terrell, upon your kind request in the sleep clinic today for initial consultation of his sleep disorder, in particular, concern for underlying obstructive sleep apnea. The patient is unaccompanied today. As you know, Jacob Terrell is a 49 year old right-handed gentleman with an underlying medical history of allergic rhinitis, and mildly overweight state, who reports snoring and witnessed apneas, per wife's report. I reviewed your office note from 11/14/2018, which you kindly included. His Epworth sleepiness score is 13 out of 24 today, fatigue score is 31 out of 63. He does not wake up rested. He lives at home with his wife and children, he has 3 children, ages 48, 72 and 2. The 72-year-old and 70-year-old girls share a  bedroom. He has a 82-year-old son who has his own bedroom. They do have a dog in the house which typically sleeps in the bed with them. They have a TV in the bedroom but it is typically not utilized, they do have a white noise machine and a monitor for the kids in their bedroom. He quit smoking in 2013, but uses nicotine vapor, he drinks alcohol about 6 drinks a week on average, caffeine in the form of coffee, about 16 ounces per day on average.his father has sleep apnea and uses a CPAP machine, his older brother has sleep apnea and uses a CPAP machine. Patient had ear tubes as a child but no tonsillectomy. Bedtime is around midnight and rise time around 6:30. He denies night to night nocturia or recurrent morning headaches. He works in Press [employer] in [employer] and has to drive to [place] on a regular basis. He sometimes travels along the [place] as well.   REVIEW OF SYSTEMS: Out of a complete 14 system review of symptoms, the patient complains only of the following symptoms, none and all other reviewed systems are negative.  Epworth sleepiness scale: 3   ALLERGIES: Allergies  Allergen Reactions  . Amoxicillin     HOME MEDICATIONS: Outpatient Medications Prior to Visit  Medication Sig Dispense Refill  . cyclobenzaprine (FLEXERIL) 5 MG tablet Take 5 mg by mouth at bedtime.    Marland Kitchen  fluconazole (DIFLUCAN) 150 MG tablet Take 1 tablet (150 mg total) by mouth once a week. 4 tablet 1  . latanoprost (XALATAN) 0.005 % ophthalmic solution Place 1 drop into both eyes at bedtime.    Marland Kitchen neomycin-polymyxin-hydrocortisone (CORTISPORIN) OTIC solution Apply 1-2 drops to toe BID after soaking 10 mL 1  . rosuvastatin (CRESTOR) 5 MG tablet Take 5 mg by mouth daily.    Marland Kitchen terbinafine (LAMISIL) 250 MG tablet Take 250 mg by mouth daily.     No facility-administered medications prior to visit.    PAST MEDICAL HISTORY: Past Medical History:  Diagnosis Date  . Toenail fungus     PAST SURGICAL HISTORY: Past  Surgical History:  Procedure Laterality Date  . ARTHROSCOPIC REPAIR ACL    . HERNIA REPAIR    . INNER EAR SURGERY      FAMILY HISTORY: History reviewed. No pertinent family history.  SOCIAL HISTORY: Social History   Socioeconomic History  . Marital status: Married    Spouse name: Not on file  . Number of children: Not on file  . Years of education: Not on file  . Highest education level: Not on file  Occupational History  . Not on file  Tobacco Use  . Smoking status: Former Research scientist (life sciences)  . Smokeless tobacco: Never Used  Substance and Sexual Activity  . Alcohol use: Yes    Alcohol/week: 6.0 standard drinks    Types: 6 Standard drinks or equivalent per week  . Drug use: Not Currently  . Sexual activity: Not on file  Other Topics Concern  . Not on file  Social History Narrative  . Not on file   Social Determinants of Health   Financial Resource Strain:   . Difficulty of Paying Living Expenses: Not on file  Food Insecurity:   . Worried About Charity fundraiser in the Last Year: Not on file  . Ran Out of Food in the Last Year: Not on file  Transportation Needs:   . Lack of Transportation (Medical): Not on file  . Lack of Transportation (Non-Medical): Not on file  Physical Activity:   . Days of Exercise per Week: Not on file  . Minutes of Exercise per Session: Not on file  Stress:   . Feeling of Stress : Not on file  Social Connections:   . Frequency of Communication with Friends and Family: Not on file  . Frequency of Social Gatherings with Friends and Family: Not on file  . Attends Religious Services: Not on file  . Active Member of Clubs or Organizations: Not on file  . Attends Archivist Meetings: Not on file  . Marital Status: Not on file  Intimate Partner Violence:   . Fear of Current or Ex-Partner: Not on file  . Emotionally Abused: Not on file  . Physically Abused: Not on file  . Sexually Abused: Not on file      PHYSICAL EXAM  Vitals:    11/28/19 1451  BP: (!) 137/91  Pulse: 89  Temp: (!) 97 F (36.1 C)  TempSrc: Oral  Weight: 176 lb 6.4 oz (80 kg)  Height: 5\' 8"  (1.727 m)   Body mass index is 26.82 kg/m.  Generalized: Well developed, in no acute distress  Neurological examination  Mentation: Alert oriented to time, place, history taking. Follows all commands speech and language fluent Cranial nerve II-XII: Pupils were equal round reactive to light. Extraocular movements were full, visual field were full Motor: The motor testing reveals 5 over  5 strength of all 4 extremities. Good symmetric motor tone is noted throughout. .  Gait and station: Gait is normal.   DIAGNOSTIC DATA (LABS, IMAGING, TESTING) - I reviewed patient records, labs, notes, testing and imaging myself where available.  No flowsheet data found.   Lab Results  Component Value Date   HGB 14.3 02/10/2011   HCT 42.0 02/10/2011      Component Value Date/Time   NA 140 02/10/2011 1325   K 4.6 02/10/2011 1325   CL 108 02/10/2011 1325   GLUCOSE 102 (H) 02/10/2011 1325   BUN 21 02/10/2011 1325   CREATININE 1.5 02/10/2011 1325   No results found for: CHOL, HDL, LDLCALC, LDLDIRECT, TRIG, CHOLHDL No results found for: HGBA1C No results found for: VITAMINB12 No results found for: TSH     ASSESSMENT AND PLAN 49 y.o. year old male  has a past medical history of Toenail fungus. here with     ICD-10-CM   1. OSA on CPAP  G47.33 For home use only DME continuous positive airway pressure (CPAP)   Z99.89     Mr Boatner reports that he is doing very well with CPAP therapy.  He has noted significant improvement in sleep quality and energy levels.  Compliance report reveals excellent daily compliance.  4-hour compliance currently at 80%.  He was encouraged to use CPAP nightly and for greater than 4 hours each night.  He will continue to follow-up with DME for any supplies needed.  He is aware that he may reach out to [place] with any concerns.  He will return to  see [place] in 1 year, sooner if needed.  He verbalizes understanding and agreement with this plan.   Orders Placed This Encounter  Procedures  . For home use only DME continuous positive airway pressure (CPAP)    Supplies    Order Specific Question:   Length of Need    Answer:   Lifetime    Order Specific Question:   Patient has OSA or probable OSA    Answer:   Yes    Order Specific Question:   Is the patient currently using CPAP in the home    Answer:   Yes    Order Specific Question:   Settings    Answer:   Other see comments    Order Specific Question:   CPAP supplies needed    Answer:   Mask, headgear, cushions, filters, heated tubing and water chamber     No orders of the defined types were placed in this encounter.     I spent 15 minutes with the patient. 50% of this time was spent counseling and educating patient on plan of care and medications.    Debbora Presto, FNP-C 11/28/2019, 3:23 PM [facility] [address] [place], [place] [address] [phone]  I reviewed the above note and documentation by the Nurse Practitioner and agree with the history, exam, assessment and plan as outlined above. I was available for consultation. Star Age, MD, PhD [facility])

## 2019-11-28 NOTE — Patient Instructions (Signed)
Continue CPAP nightly and greater than 4 hours each night   Follow up in 1 year, sooner if needed   Sleep Apnea Sleep apnea affects breathing during sleep. It causes breathing to stop for a short time or to become shallow. It can also increase the risk of:  Heart attack.  Stroke.  Being very overweight (obese).  Diabetes.  Heart failure.  Irregular heartbeat. The goal of treatment is to help you breathe normally again. What are the causes? There are three kinds of sleep apnea:  Obstructive sleep apnea. This is caused by a blocked or collapsed airway.  Central sleep apnea. This happens when the brain does not send the right signals to the muscles that control breathing.  Mixed sleep apnea. This is a combination of obstructive and central sleep apnea. The most common cause of this condition is a collapsed or blocked airway. This can happen if:  Your throat muscles are too relaxed.  Your tongue and tonsils are too large.  You are overweight.  Your airway is too small. What increases the risk?  Being overweight.  Smoking.  Having a small airway.  Being older.  Being male.  Drinking alcohol.  Taking medicines to calm yourself (sedatives or tranquilizers).  Having family members with the condition. What are the signs or symptoms?  Trouble staying asleep.  Being sleepy or tired during the day.  Getting angry a lot.  Loud snoring.  Headaches in the morning.  Not being able to focus your mind (concentrate).  Forgetting things.  Less interest in sex.  Mood swings.  Personality changes.  Feelings of sadness (depression).  Waking up a lot during the night to pee (urinate).  Dry mouth.  Sore throat. How is this diagnosed?  Your medical history.  A physical exam.  A test that is done when you are sleeping (sleep study). The test is most often done in a sleep lab but may also be done at home. How is this treated?   Sleeping on your  side.  Using a medicine to get rid of mucus in your nose (decongestant).  Avoiding the use of alcohol, medicines to help you relax, or certain pain medicines (narcotics).  Losing weight, if needed.  Changing your diet.  Not smoking.  Using a machine to open your airway while you sleep, such as: ? An oral appliance. This is a mouthpiece that shifts your lower jaw forward. ? A CPAP device. This device blows air through a mask when you breathe out (exhale). ? An EPAP device. This has valves that you put in each nostril. ? A BPAP device. This device blows air through a mask when you breathe in (inhale) and breathe out.  Having surgery if other treatments do not work. It is important to get treatment for sleep apnea. Without treatment, it can lead to:  High blood pressure.  Coronary artery disease.  In men, not being able to have an erection (impotence).  Reduced thinking ability. Follow these instructions at home: Lifestyle  Make changes that your doctor recommends.  Eat a healthy diet.  Lose weight if needed.  Avoid alcohol, medicines to help you relax, and some pain medicines.  Do not use any products that contain nicotine or tobacco, such as cigarettes, e-cigarettes, and chewing tobacco. If you need help quitting, ask your doctor. General instructions  Take over-the-counter and prescription medicines only as told by your doctor.  If you were given a machine to use while you sleep, use it only   as told by your doctor.  If you are having surgery, make sure to tell your doctor you have sleep apnea. You may need to bring your device with you.  Keep all follow-up visits as told by your doctor. This is important. Contact a doctor if:  The machine that you were given to use during sleep bothers you or does not seem to be working.  You do not get better.  You get worse. Get help right away if:  Your chest hurts.  You have trouble breathing in enough air.  You have  an uncomfortable feeling in your back, arms, or stomach.  You have trouble talking.  One side of your body feels weak.  A part of your face is hanging down. These symptoms may be an emergency. Do not wait to see if the symptoms will go away. Get medical help right away. Call your local emergency services (911 in the [place] Do not drive yourself to the hospital. Summary  This condition affects breathing during sleep.  The most common cause is a collapsed or blocked airway.  The goal of treatment is to help you breathe normally while you sleep. This information is not intended to replace advice given to you by your health care provider. Make sure you discuss any questions you have with your health care provider. Document Revised: 08/12/2018 Document Reviewed: 06/21/2018 Elsevier Patient Education  2020 Elsevier Inc.  

## 2019-12-12 DIAGNOSIS — G4733 Obstructive sleep apnea (adult) (pediatric): Secondary | ICD-10-CM | POA: Diagnosis not present

## 2020-01-09 ENCOUNTER — Ambulatory Visit: Payer: Self-pay

## 2020-01-11 DIAGNOSIS — Z23 Encounter for immunization: Secondary | ICD-10-CM | POA: Diagnosis not present

## 2020-01-12 ENCOUNTER — Other Ambulatory Visit: Payer: Self-pay | Admitting: Podiatry

## 2020-01-12 NOTE — Telephone Encounter (Signed)
Dr. Price please advice 

## 2020-01-14 ENCOUNTER — Ambulatory Visit: Payer: Self-pay

## 2020-01-23 DIAGNOSIS — E782 Mixed hyperlipidemia: Secondary | ICD-10-CM | POA: Diagnosis not present

## 2020-01-23 DIAGNOSIS — R030 Elevated blood-pressure reading, without diagnosis of hypertension: Secondary | ICD-10-CM | POA: Diagnosis not present

## 2020-01-26 DIAGNOSIS — M549 Dorsalgia, unspecified: Secondary | ICD-10-CM | POA: Diagnosis not present

## 2020-01-26 DIAGNOSIS — H409 Unspecified glaucoma: Secondary | ICD-10-CM | POA: Diagnosis not present

## 2020-01-26 DIAGNOSIS — Z719 Counseling, unspecified: Secondary | ICD-10-CM | POA: Diagnosis not present

## 2020-01-26 DIAGNOSIS — E782 Mixed hyperlipidemia: Secondary | ICD-10-CM | POA: Diagnosis not present

## 2020-01-29 ENCOUNTER — Ambulatory Visit: Payer: Self-pay | Attending: Internal Medicine

## 2020-01-29 DIAGNOSIS — Z23 Encounter for immunization: Secondary | ICD-10-CM

## 2020-01-29 NOTE — Progress Notes (Signed)
   Covid-19 Vaccination Clinic  Name:  MERWYN MOLLNER    MRN: [id number] DOB: May 13, 1971  01/29/2020  Mr. Gavia was observed post Covid-19 immunization for 15 minutes without incident. He was provided with Vaccine Information Sheet and instruction to access the V-Safe system.   Mr. Kampfer was instructed to call 911 with any severe reactions post vaccine: Marland Kitchen Difficulty breathing  . Swelling of face and throat  . A fast heartbeat  . A bad rash all over body  . Dizziness and weakness   Immunizations Administered    Name Date Dose VIS Date Route   Pfizer COVID-19 Vaccine 01/29/2020 11:40 AM 0.3 mL 10/20/2019 Intramuscular   Manufacturer: [place]   Lot: [id number]   [place]: [id number]

## 2020-02-09 DIAGNOSIS — G4733 Obstructive sleep apnea (adult) (pediatric): Secondary | ICD-10-CM | POA: Diagnosis not present

## 2020-02-21 ENCOUNTER — Ambulatory Visit: Payer: Self-pay | Attending: Internal Medicine

## 2020-02-21 DIAGNOSIS — Z23 Encounter for immunization: Secondary | ICD-10-CM

## 2020-02-21 NOTE — Progress Notes (Signed)
   Covid-19 Vaccination Clinic  Name:  Jacob Terrell    MRN: [id number] DOB: 01-27-1971  02/21/2020  [name] was observed post Covid-19 immunization for 15 minutes without incident. He was provided with Vaccine Information Sheet and instruction to access the V-Safe system.   [name] was instructed to call 911 with any severe reactions post vaccine: Marland Kitchen Difficulty breathing  . Swelling of face and throat  . A fast heartbeat  . A bad rash all over body  . Dizziness and weakness   Immunizations Administered    Name Date Dose VIS Date Route   Pfizer COVID-19 Vaccine 02/21/2020 12:02 PM 0.3 mL 10/20/2019 Intramuscular   Manufacturer: [place]   Lot: [id number]   [place]: [id number]

## 2020-02-22 DIAGNOSIS — H401131 Primary open-angle glaucoma, bilateral, mild stage: Secondary | ICD-10-CM | POA: Diagnosis not present

## 2020-03-07 ENCOUNTER — Other Ambulatory Visit: Payer: Self-pay | Admitting: Podiatry

## 2020-03-10 DIAGNOSIS — G4733 Obstructive sleep apnea (adult) (pediatric): Secondary | ICD-10-CM | POA: Diagnosis not present

## 2020-03-14 ENCOUNTER — Telehealth: Payer: Self-pay | Admitting: *Deleted

## 2020-03-14 MED ORDER — FLUCONAZOLE 150 MG PO TABS: ORAL_TABLET | ORAL | 1 refills | Status: DC

## 2020-03-14 NOTE — Telephone Encounter (Signed)
Pt states Dr. March Rummage said he could get a refill any time he wants.

## 2020-03-15 NOTE — Telephone Encounter (Signed)
Left message informing pt Dr. March Rummage had called the requested medication Diflucan to the [id number].

## 2020-04-10 DIAGNOSIS — G4733 Obstructive sleep apnea (adult) (pediatric): Secondary | ICD-10-CM | POA: Diagnosis not present

## 2020-05-01 ENCOUNTER — Other Ambulatory Visit: Payer: Self-pay | Admitting: Podiatry

## 2020-05-10 DIAGNOSIS — G4733 Obstructive sleep apnea (adult) (pediatric): Secondary | ICD-10-CM | POA: Diagnosis not present

## 2020-05-28 DIAGNOSIS — Z20822 Contact with and (suspected) exposure to covid-19: Secondary | ICD-10-CM | POA: Diagnosis not present

## 2020-06-04 DIAGNOSIS — Z20822 Contact with and (suspected) exposure to covid-19: Secondary | ICD-10-CM | POA: Diagnosis not present

## 2020-06-10 DIAGNOSIS — G4733 Obstructive sleep apnea (adult) (pediatric): Secondary | ICD-10-CM | POA: Diagnosis not present

## 2020-06-11 DIAGNOSIS — Z20822 Contact with and (suspected) exposure to covid-19: Secondary | ICD-10-CM | POA: Diagnosis not present

## 2020-06-18 DIAGNOSIS — H401131 Primary open-angle glaucoma, bilateral, mild stage: Secondary | ICD-10-CM | POA: Diagnosis not present

## 2020-06-19 DIAGNOSIS — Z20822 Contact with and (suspected) exposure to covid-19: Secondary | ICD-10-CM | POA: Diagnosis not present

## 2020-07-11 DIAGNOSIS — G4733 Obstructive sleep apnea (adult) (pediatric): Secondary | ICD-10-CM | POA: Diagnosis not present

## 2020-08-07 DIAGNOSIS — Z125 Encounter for screening for malignant neoplasm of prostate: Secondary | ICD-10-CM | POA: Diagnosis not present

## 2020-08-07 DIAGNOSIS — Z0000 Encounter for general adult medical examination without abnormal findings: Secondary | ICD-10-CM | POA: Diagnosis not present

## 2020-08-07 DIAGNOSIS — Z1159 Encounter for screening for other viral diseases: Secondary | ICD-10-CM | POA: Diagnosis not present

## 2020-08-13 DIAGNOSIS — Z23 Encounter for immunization: Secondary | ICD-10-CM | POA: Diagnosis not present

## 2020-08-13 DIAGNOSIS — Z1211 Encounter for screening for malignant neoplasm of colon: Secondary | ICD-10-CM | POA: Diagnosis not present

## 2020-08-13 DIAGNOSIS — E782 Mixed hyperlipidemia: Secondary | ICD-10-CM | POA: Diagnosis not present

## 2020-08-13 DIAGNOSIS — Z0000 Encounter for general adult medical examination without abnormal findings: Secondary | ICD-10-CM | POA: Diagnosis not present

## 2020-08-13 DIAGNOSIS — I10 Essential (primary) hypertension: Secondary | ICD-10-CM | POA: Diagnosis not present

## 2020-08-26 ENCOUNTER — Telehealth: Payer: Self-pay | Admitting: Podiatry

## 2020-08-26 NOTE — Telephone Encounter (Signed)
Patient would like a refill on his medication (fluconazole). Preferred pharmacy is [facility] [address].

## 2020-09-06 ENCOUNTER — Encounter: Payer: Self-pay | Admitting: Podiatry

## 2020-09-06 MED ORDER — FLUCONAZOLE 150 MG PO TABS: ORAL_TABLET | ORAL | 1 refills | Status: DC

## 2020-09-06 NOTE — Addendum Note (Signed)
Addended by: Hardie Pulley on: 09/06/2020 04:51 PM   Modules accepted: Orders

## 2020-09-10 DIAGNOSIS — I10 Essential (primary) hypertension: Secondary | ICD-10-CM | POA: Diagnosis not present

## 2020-09-25 DIAGNOSIS — H401131 Primary open-angle glaucoma, bilateral, mild stage: Secondary | ICD-10-CM | POA: Diagnosis not present

## 2020-10-26 ENCOUNTER — Other Ambulatory Visit: Payer: Self-pay | Admitting: Podiatry

## 2020-10-27 NOTE — Telephone Encounter (Signed)
Please advise 

## 2020-11-28 ENCOUNTER — Ambulatory Visit: Payer: BC Managed Care – PPO | Admitting: Family Medicine

## 2020-11-28 ENCOUNTER — Encounter: Payer: Self-pay | Admitting: Family Medicine

## 2020-11-28 ENCOUNTER — Other Ambulatory Visit: Payer: Self-pay

## 2020-11-28 VITALS — BP 138/84 | HR 82 | Ht 68.0 in | Wt 186.2 lb

## 2020-11-28 DIAGNOSIS — G4733 Obstructive sleep apnea (adult) (pediatric): Secondary | ICD-10-CM

## 2020-11-28 DIAGNOSIS — Z9989 Dependence on other enabling machines and devices: Secondary | ICD-10-CM

## 2020-11-28 NOTE — Patient Instructions (Signed)
Please continue using your CPAP regularly. While your insurance requires that you use CPAP at least 4 hours each night on 70% of the nights, I recommend, that you not skip any nights and use it throughout the night if you can. Getting used to CPAP and staying with the treatment long term does take time and patience and discipline. Untreated obstructive sleep apnea when it is moderate to severe can have an adverse impact on cardiovascular health and raise her risk for heart disease, arrhythmias, hypertension, congestive heart failure, stroke and diabetes. Untreated obstructive sleep apnea causes sleep disruption, nonrestorative sleep, and sleep deprivation. This can have an impact on your day to day functioning and cause daytime sleepiness and impairment of cognitive function, memory loss, mood disturbance, and problems focussing. Using CPAP regularly can improve these symptoms.   Follow up in 1 year   Sleep Apnea Sleep apnea affects breathing during sleep. It causes breathing to stop for a short time or to become shallow. It can also increase the risk of:  Heart attack.  Stroke.  Being very overweight (obese).  Diabetes.  Heart failure.  Irregular heartbeat. The goal of treatment is to help you breathe normally again. What are the causes? There are three kinds of sleep apnea:  Obstructive sleep apnea. This is caused by a blocked or collapsed airway.  Central sleep apnea. This happens when the brain does not send the right signals to the muscles that control breathing.  Mixed sleep apnea. This is a combination of obstructive and central sleep apnea. The most common cause of this condition is a collapsed or blocked airway. This can happen if:  Your throat muscles are too relaxed.  Your tongue and tonsils are too large.  You are overweight.  Your airway is too small.   What increases the risk?  Being overweight.  Smoking.  Having a small airway.  Being older.  Being  male.  Drinking alcohol.  Taking medicines to calm yourself (sedatives or tranquilizers).  Having family members with the condition. What are the signs or symptoms?  Trouble staying asleep.  Being sleepy or tired during the day.  Getting angry a lot.  Loud snoring.  Headaches in the morning.  Not being able to focus your mind (concentrate).  Forgetting things.  Less interest in sex.  Mood swings.  Personality changes.  Feelings of sadness (depression).  Waking up a lot during the night to pee (urinate).  Dry mouth.  Sore throat. How is this diagnosed?  Your medical history.  A physical exam.  A test that is done when you are sleeping (sleep study). The test is most often done in a sleep lab but may also be done at home. How is this treated?  Sleeping on your side.  Using a medicine to get rid of mucus in your nose (decongestant).  Avoiding the use of alcohol, medicines to help you relax, or certain pain medicines (narcotics).  Losing weight, if needed.  Changing your diet.  Not smoking.  Using a machine to open your airway while you sleep, such as: ? An oral appliance. This is a mouthpiece that shifts your lower jaw forward. ? A CPAP device. This device blows air through a mask when you breathe out (exhale). ? An EPAP device. This has valves that you put in each nostril. ? A BPAP device. This device blows air through a mask when you breathe in (inhale) and breathe out.  Having surgery if other treatments do not work. It   is important to get treatment for sleep apnea. Without treatment, it can lead to:  High blood pressure.  Coronary artery disease.  In men, not being able to have an erection (impotence).  Reduced thinking ability.   Follow these instructions at home: Lifestyle  Make changes that your doctor recommends.  Eat a healthy diet.  Lose weight if needed.  Avoid alcohol, medicines to help you relax, and some pain  medicines.  Do not use any products that contain nicotine or tobacco, such as cigarettes, e-cigarettes, and chewing tobacco. If you need help quitting, ask your doctor. General instructions  Take over-the-counter and prescription medicines only as told by your doctor.  If you were given a machine to use while you sleep, use it only as told by your doctor.  If you are having surgery, make sure to tell your doctor you have sleep apnea. You may need to bring your device with you.  Keep all follow-up visits as told by your doctor. This is important. Contact a doctor if:  The machine that you were given to use during sleep bothers you or does not seem to be working.  You do not get better.  You get worse. Get help right away if:  Your chest hurts.  You have trouble breathing in enough air.  You have an uncomfortable feeling in your back, arms, or stomach.  You have trouble talking.  One side of your body feels weak.  A part of your face is hanging down. These symptoms may be an emergency. Do not wait to see if the symptoms will go away. Get medical help right away. Call your local emergency services (911 in the [place] Do not drive yourself to the hospital. Summary  This condition affects breathing during sleep.  The most common cause is a collapsed or blocked airway.  The goal of treatment is to help you breathe normally while you sleep. This information is not intended to replace advice given to you by your health care provider. Make sure you discuss any questions you have with your health care provider. Document Revised: 08/12/2018 Document Reviewed: 06/21/2018 Elsevier Patient Education  2021 Elsevier Inc.  

## 2020-11-28 NOTE — Progress Notes (Signed)
Community message sent to [employer] in regards to new orders

## 2020-11-28 NOTE — Progress Notes (Addendum)
PATIENT: Jacob Terrell DOB: [birth year]  REASON FOR VISIT: follow up HISTORY FROM: patient  Chief Complaint  Patient presents with  . Follow-up    RM 2 alone  Pt is well, need more CPAP supply but sleeps good     HISTORY OF PRESENT ILLNESS: Today 11/28/20  He returns today for follow-up for CPAP.  He denies any difficulty with CPAP therapy at home.  He is using his machine consistently.  He feels that he is sleeping well.  He usually gets about 6 hours of sleep each night.  If he sleeps longer he does not feel as refreshed.  Compliance report dated 10/28/2020 through 11/26/2020 reveals that he CPAP 30 of the past 30 days for compliance of 100%.  He is CPAP greater than 4 hours 28 of the past 30 days for compliance of 93%.  Average usage on days used was 6 hours and 11 minutes.  Residual AHI was 1.0 on 7 to 13 cm of water and an EPR of 3.  There was no significant leak noted.   11/28/2019 ALL: WAYLEN Terrell is a 50 y.o. male here today for follow up for OSA on CPAP. HST in 08/2019 revealed moderate OSA with AHI of 23.8 and O2 nadir of 83%. He returns today fr initial CPAP compliance review.  He reports doing very well on CPAP therapy.  He is using CPAP nightly.  He has noted significant improvements in sleep quality and energy levels.  He does admit that he will sometimes drift off to sleep on the couch and not get the full 4-hours recommended. He denies any concerns today.  Compliance report dated 10/28/2019 through 11/26/2019 reveals that he used CPAP 30 of the last 30 days for compliance 100%.  He used CPAP greater than 4 hours 24 of the last 30 days for compliance of 80%.  Average usage was 5 hours and 20 minutes.  Residual AHI was 1.3 on 7 to 13 cm of water and an EPR of 3.  There was no significant leak noted.  HISTORY: (copied from Dr Guadelupe Sabin note on 01/03/2019)  Dear [name],   I saw your patient, Jacob Terrell, upon your kind request in the sleep clinic today for initial  consultation of his sleep disorder, in particular, concern for underlying obstructive sleep apnea. The patient is unaccompanied today. As you know, Ms. Miklas is a 50 year old right-handed gentleman with an underlying medical history of allergic rhinitis, and mildly overweight state, who reports snoring and witnessed apneas, per wife's report. I reviewed your office note from 11/14/2018, which you kindly included. His Epworth sleepiness score is 13 out of 24 today, fatigue score is 31 out of 63. He does not wake up rested. He lives at home with his wife and children, he has 3 children, ages 35, 38 and 2. The 65-year-old and 66-year-old girls share a bedroom. He has a 69-year-old son who has his own bedroom. They do have a dog in the house which typically sleeps in the bed with them. They have a TV in the bedroom but it is typically not utilized, they do have a white noise machine and a monitor for the kids in their bedroom. He quit smoking in 2013, but uses nicotine vapor, he drinks alcohol about 6 drinks a week on average, caffeine in the form of coffee, about 16 ounces per day on average.his father has sleep apnea and uses a CPAP machine, his older brother has sleep apnea and uses a  CPAP machine. Patient had ear tubes as a child but no tonsillectomy. Bedtime is around midnight and rise time around 6:30. He denies night to night nocturia or recurrent morning headaches. He works in Air[employer] in [employer] and has to drive to [place] on a regular basis. He sometimes travels along the [address] as well.   REVIEW OF SYSTEMS: Out of a complete 14 system review of symptoms, the patient complains only of the following symptoms, none and all other reviewed systems are negative.  Epworth sleepiness scale: 5 FSS: 28   ALLERGIES: Allergies  Allergen Reactions  . Amoxicillin     HOME MEDICATIONS: Outpatient Medications Prior to Visit  Medication Sig Dispense Refill  . fluconazole (DIFLUCAN) 150 MG tablet TAKE 1  TABLET BY MOUTH ONE TIME PER WEEK 4 tablet 1  . latanoprost (XALATAN) 0.005 % ophthalmic solution Place 1 drop into both eyes at bedtime.    . rosuvastatin (CRESTOR) 5 MG tablet Take 5 mg by mouth daily.    . valsartan (DIOVAN) 80 MG tablet Take 80 mg by mouth daily.    Marland Kitchen neomycin-polymyxin-hydrocortisone (CORTISPORIN) OTIC solution Apply 1-2 drops to toe BID after soaking 10 mL 1  . cyclobenzaprine (FLEXERIL) 5 MG tablet Take 5 mg by mouth at bedtime. (Patient not taking: Reported on 11/28/2020)     No facility-administered medications prior to visit.    PAST MEDICAL HISTORY: Past Medical History:  Diagnosis Date  . Toenail fungus     PAST SURGICAL HISTORY: Past Surgical History:  Procedure Laterality Date  . ARTHROSCOPIC REPAIR ACL    . HERNIA REPAIR    . INNER EAR SURGERY      FAMILY HISTORY: History reviewed. No pertinent family history.  SOCIAL HISTORY: Social History   Socioeconomic History  . Marital status: Married    Spouse name: Not on file  . Number of children: Not on file  . Years of education: Not on file  . Highest education level: Not on file  Occupational History  . Not on file  Tobacco Use  . Smoking status: Former Games developer  . Smokeless tobacco: Never Used  Substance and Sexual Activity  . Alcohol use: Yes    Alcohol/week: 6.0 standard drinks    Types: 6 Standard drinks or equivalent per week  . Drug use: Not Currently  . Sexual activity: Not on file  Other Topics Concern  . Not on file  Social History Narrative  . Not on file   Social Determinants of Health   Financial Resource Strain: Not on file  Food Insecurity: Not on file  Transportation Needs: Not on file  Physical Activity: Not on file  Stress: Not on file  Social Connections: Not on file  Intimate Partner Violence: Not on file      PHYSICAL EXAM  Vitals:   11/28/20 1456  BP: 138/84  Pulse: 82  Weight: 186 lb 3.2 oz (84.5 kg)  Height: 5\' 8"  (1.727 m)   Body mass index is  28.31 kg/m.  Generalized: Well developed, in no acute distress  Neurological examination  Mentation: Alert oriented to time, place, history taking. Follows all commands speech and language fluent Cranial nerve II-XII: Pupils were equal round reactive to light. Extraocular movements were full, visual field were full Motor: The motor testing reveals 5 over 5 strength of all 4 extremities. Good symmetric motor tone is noted throughout. .  Gait and station: Gait is normal.   DIAGNOSTIC DATA (LABS, IMAGING, TESTING) - I reviewed patient  records, labs, notes, testing and imaging myself where available.  No flowsheet data found.   Lab Results  Component Value Date   HGB 14.3 02/10/2011   HCT 42.0 02/10/2011      Component Value Date/Time   NA 140 02/10/2011 1325   K 4.6 02/10/2011 1325   CL 108 02/10/2011 1325   GLUCOSE 102 (H) 02/10/2011 1325   BUN 21 02/10/2011 1325   CREATININE 1.5 02/10/2011 1325   No results found for: CHOL, HDL, LDLCALC, LDLDIRECT, TRIG, CHOLHDL No results found for: HGBA1C No results found for: VITAMINB12 No results found for: TSH     ASSESSMENT AND PLAN 49 y.o. year old male  has a past medical history of Toenail fungus. here with   No diagnosis found.   Mr Routh reports that he is doing very well with CPAP therapy.  Compliance report reveals excellent compliance.  He was encouraged to use CPAP nightly and for greater than 4 hours each night.  He will continue to follow-up with DME for any supplies needed.  He is aware that he may reach out to [place] with any concerns.  He will return to see [place] in 1 year, sooner if needed.  He verbalizes understanding and agreement with this plan.   No orders of the defined types were placed in this encounter.    No orders of the defined types were placed in this encounter.     I spent 15 minutes with the patient. 50% of this time was spent counseling and educating patient on plan of care and medications.    Debbora Presto, FNP-C 11/28/2020, 3:14 PM [facility] [address], [place], [place] [address] [phone]  I reviewed the above note and documentation by the Nurse Practitioner and agree with the history, exam, assessment and plan as outlined above. I was available for consultation. Star Age, MD, PhD [facility])

## 2020-12-02 NOTE — Progress Notes (Addendum)
Message received 11/28/20: "Got it, thanks!   Janett Billow"    Message reciveved 12/23/20: "Patients orders were sent to our resupply department but patient has a collections balance on file. There may be some delay in him getting supplies due to this.  Thanks! Christina Soldano PAP, EMR Intake Manager [facility]"

## 2021-01-01 ENCOUNTER — Other Ambulatory Visit: Payer: Self-pay | Admitting: Podiatry

## 2021-01-01 NOTE — Telephone Encounter (Signed)
Please advise 

## 2021-01-23 DIAGNOSIS — H401131 Primary open-angle glaucoma, bilateral, mild stage: Secondary | ICD-10-CM | POA: Diagnosis not present

## 2021-03-13 ENCOUNTER — Other Ambulatory Visit: Payer: Self-pay | Admitting: Podiatry

## 2021-03-18 DIAGNOSIS — R5383 Other fatigue: Secondary | ICD-10-CM | POA: Diagnosis not present

## 2021-03-18 DIAGNOSIS — E663 Overweight: Secondary | ICD-10-CM | POA: Diagnosis not present

## 2021-03-18 DIAGNOSIS — E782 Mixed hyperlipidemia: Secondary | ICD-10-CM | POA: Diagnosis not present

## 2021-03-18 DIAGNOSIS — I10 Essential (primary) hypertension: Secondary | ICD-10-CM | POA: Diagnosis not present

## 2021-05-20 DIAGNOSIS — H60501 Unspecified acute noninfective otitis externa, right ear: Secondary | ICD-10-CM | POA: Diagnosis not present

## 2021-05-20 DIAGNOSIS — I10 Essential (primary) hypertension: Secondary | ICD-10-CM | POA: Diagnosis not present

## 2021-05-20 DIAGNOSIS — H6121 Impacted cerumen, right ear: Secondary | ICD-10-CM | POA: Diagnosis not present

## 2021-05-22 DIAGNOSIS — H401131 Primary open-angle glaucoma, bilateral, mild stage: Secondary | ICD-10-CM | POA: Diagnosis not present

## 2021-07-24 ENCOUNTER — Encounter: Payer: Self-pay | Admitting: Gastroenterology

## 2021-08-12 DIAGNOSIS — Z0000 Encounter for general adult medical examination without abnormal findings: Secondary | ICD-10-CM | POA: Diagnosis not present

## 2021-08-12 DIAGNOSIS — Z125 Encounter for screening for malignant neoplasm of prostate: Secondary | ICD-10-CM | POA: Diagnosis not present

## 2021-08-14 DIAGNOSIS — Z23 Encounter for immunization: Secondary | ICD-10-CM | POA: Diagnosis not present

## 2021-08-14 DIAGNOSIS — Z0000 Encounter for general adult medical examination without abnormal findings: Secondary | ICD-10-CM | POA: Diagnosis not present

## 2021-08-14 DIAGNOSIS — H6121 Impacted cerumen, right ear: Secondary | ICD-10-CM | POA: Diagnosis not present

## 2021-08-26 ENCOUNTER — Ambulatory Visit (AMBULATORY_SURGERY_CENTER): Payer: Self-pay

## 2021-08-26 ENCOUNTER — Encounter: Payer: Self-pay | Admitting: Gastroenterology

## 2021-08-26 ENCOUNTER — Other Ambulatory Visit: Payer: Self-pay

## 2021-08-26 VITALS — Ht 68.0 in | Wt 172.0 lb

## 2021-08-26 DIAGNOSIS — Z1211 Encounter for screening for malignant neoplasm of colon: Secondary | ICD-10-CM

## 2021-08-26 NOTE — Progress Notes (Signed)

## 2021-09-10 ENCOUNTER — Ambulatory Visit (AMBULATORY_SURGERY_CENTER): Payer: BC Managed Care – PPO | Admitting: Gastroenterology

## 2021-09-10 ENCOUNTER — Encounter: Payer: Self-pay | Admitting: Gastroenterology

## 2021-09-10 VITALS — BP 112/76 | HR 56 | Temp 98.2°F | Resp 16 | Ht 68.0 in | Wt 172.0 lb

## 2021-09-10 DIAGNOSIS — D128 Benign neoplasm of rectum: Secondary | ICD-10-CM | POA: Diagnosis not present

## 2021-09-10 DIAGNOSIS — D127 Benign neoplasm of rectosigmoid junction: Secondary | ICD-10-CM | POA: Diagnosis not present

## 2021-09-10 DIAGNOSIS — Z1211 Encounter for screening for malignant neoplasm of colon: Secondary | ICD-10-CM

## 2021-09-10 MED ORDER — SODIUM CHLORIDE 0.9 % IV SOLN: 500.0000 mL | Freq: Once | INTRAVENOUS | Status: DC

## 2021-09-10 NOTE — Progress Notes (Signed)
GASTROENTEROLOGY PROCEDURE H&P NOTE   Primary Care Physician: Fanny Bien, MD  HPI: Jacob Terrell is a 50 y.o. male who presents for Colonoscopy for screening.  Past Medical History:  Diagnosis Date   Glaucoma    Hyperlipidemia    Hypertension    Sleep apnea    cpap   Toenail fungus    Past Surgical History:  Procedure Laterality Date   ARTHROSCOPIC REPAIR ACL     HERNIA REPAIR     INNER EAR SURGERY     Current Outpatient Medications  Medication Sig Dispense Refill   cyclobenzaprine (FLEXERIL) 5 MG tablet Take 5 mg by mouth at bedtime. (Patient not taking: No sig reported)     fluconazole (DIFLUCAN) 150 MG tablet TAKE 1 TABLET BY MOUTH ONE TIME PER WEEK (Patient not taking: Reported on 08/26/2021) 4 tablet 1   latanoprost (XALATAN) 0.005 % ophthalmic solution Place 1 drop into both eyes at bedtime.     rosuvastatin (CRESTOR) 5 MG tablet Take 5 mg by mouth daily.     valsartan (DIOVAN) 80 MG tablet Take 80 mg by mouth daily.     Current Facility-Administered Medications  Medication Dose Route Frequency Provider Last Rate Last Admin   0.9 %  sodium chloride infusion  500 mL Intravenous Once Mansouraty, Telford Nab., MD        Current Outpatient Medications:    cyclobenzaprine (FLEXERIL) 5 MG tablet, Take 5 mg by mouth at bedtime. (Patient not taking: No sig reported), Disp: , Rfl:    fluconazole (DIFLUCAN) 150 MG tablet, TAKE 1 TABLET BY MOUTH ONE TIME PER WEEK (Patient not taking: Reported on 08/26/2021), Disp: 4 tablet, Rfl: 1   latanoprost (XALATAN) 0.005 % ophthalmic solution, Place 1 drop into both eyes at bedtime., Disp: , Rfl:    rosuvastatin (CRESTOR) 5 MG tablet, Take 5 mg by mouth daily., Disp: , Rfl:    valsartan (DIOVAN) 80 MG tablet, Take 80 mg by mouth daily., Disp: , Rfl:   Current Facility-Administered Medications:    0.9 %  sodium chloride infusion, 500 mL, Intravenous, Once, Mansouraty, Telford Nab., MD Allergies  Allergen Reactions    Amoxicillin    Family History  Problem Relation Age of Onset   Colon cancer Neg Hx    Colon polyps Neg Hx    Esophageal cancer Neg Hx    Rectal cancer Neg Hx    Stomach cancer Neg Hx    Social History   Socioeconomic History   Marital status: Married    Spouse name: Not on file   Number of children: Not on file   Years of education: Not on file   Highest education level: Not on file  Occupational History   Not on file  Tobacco Use   Smoking status: Former   Smokeless tobacco: Never  Vaping Use   Vaping Use: Every day  Substance and Sexual Activity   Alcohol use: Yes    Alcohol/week: 6.0 standard drinks    Types: 6 Standard drinks or equivalent per week   Drug use: Not Currently   Sexual activity: Not on file  Other Topics Concern   Not on file  Social History Narrative   Not on file   Social Determinants of Health   Financial Resource Strain: Not on file  Food Insecurity: Not on file  Transportation Needs: Not on file  Physical Activity: Not on file  Stress: Not on file  Social Connections: Not on file  Intimate Partner Violence:  Not on file    Physical Exam: Today's Vitals   09/10/21 1053  BP: 140/71  Pulse: 69  Temp: 98.2 F (36.8 C)  TempSrc: Temporal  SpO2: 98%  Weight: 172 lb (78 kg)  Height: 5\' 8"  (1.727 m)   Body mass index is 26.15 kg/m. GEN: NAD EYE: Sclerae anicteric ENT: MMM CV: Non-tachycardic GI: Soft, NT/ND NEURO:  Alert & Oriented x 3  Lab Results: No results for input(s): WBC, HGB, HCT, PLT in the last 72 hours. BMET No results for input(s): NA, K, CL, CO2, GLUCOSE, BUN, CREATININE, CALCIUM in the last 72 hours. LFT No results for input(s): PROT, ALBUMIN, AST, ALT, ALKPHOS, BILITOT, BILIDIR, IBILI in the last 72 hours. PT/INR No results for input(s): LABPROT, INR in the last 72 hours.   Impression / Plan: This is a 50 y.o.male who presents for Colonoscopy for screening.  The risks and benefits of endoscopic  evaluation/treatment were discussed with the patient and/or family; these include but are not limited to the risk of perforation, infection, bleeding, missed lesions, lack of diagnosis, severe illness requiring hospitalization, as well as anesthesia and sedation related illnesses.  The patient's history has been reviewed, patient examined, no change in status, and deemed stable for procedure.  The patient and/or family is agreeable to proceed.    Justice Britain, MD [facility]oscopy Office # [phone]

## 2021-09-10 NOTE — Progress Notes (Signed)
Thekla Colborn CRNA relieves Berry CRNA 

## 2021-09-10 NOTE — Progress Notes (Signed)
Called to room to assist during endoscopic procedure.  Patient ID and intended procedure confirmed with present staff. Received instructions for my participation in the procedure from the performing physician.  

## 2021-09-10 NOTE — Progress Notes (Signed)
Sedate, gd SR, tolerated procedure well, VSS, report to RN 

## 2021-09-10 NOTE — Progress Notes (Signed)
VS-CW  Pt's states no medical or surgical changes since previsit or office visit.  

## 2021-09-10 NOTE — Patient Instructions (Signed)
Discharge instructions given. Handouts on polyps and hemorrhoids. Resume previous medications. YOU HAD AN ENDOSCOPIC PROCEDURE TODAY AT THE [facility]:   Refer to the procedure report that was given to you for any specific questions about what was found during the examination.  If the procedure report does not answer your questions, please call your gastroenterologist to clarify.  If you requested that your care partner not be given the details of your procedure findings, then the procedure report has been included in a sealed envelope for you to review at your convenience later.  YOU SHOULD EXPECT: Some feelings of bloating in the abdomen. Passage of more gas than usual.  Walking can help get rid of the air that was put into your GI tract during the procedure and reduce the bloating. If you had a lower endoscopy (such as a colonoscopy or flexible sigmoidoscopy) you may notice spotting of blood in your stool or on the toilet paper. If you underwent a bowel prep for your procedure, you may not have a normal bowel movement for a few days.  Please Note:  You might notice some irritation and congestion in your nose or some drainage.  This is from the oxygen used during your procedure.  There is no need for concern and it should clear up in a day or so.  SYMPTOMS TO REPORT IMMEDIATELY:  Following lower endoscopy (colonoscopy or flexible sigmoidoscopy):  Excessive amounts of blood in the stool  Significant tenderness or worsening of abdominal pains  Swelling of the abdomen that is new, acute  Fever of 100F or higher   For urgent or emergent issues, a gastroenterologist can be reached at any hour by calling [phone]. Do not use MyChart messaging for urgent concerns.    DIET:  We do recommend a small meal at first, but then you may proceed to your regular diet.  Drink plenty of fluids but you should avoid alcoholic beverages for 24 hours.  ACTIVITY:  You should plan to take it  easy for the rest of today and you should NOT DRIVE or use heavy machinery until tomorrow (because of the sedation medicines used during the test).    FOLLOW UP: Our staff will call the number listed on your records 48-72 hours following your procedure to check on you and address any questions or concerns that you may have regarding the information given to you following your procedure. If we do not reach you, we will leave a message.  We will attempt to reach you two times.  During this call, we will ask if you have developed any symptoms of COVID 19. If you develop any symptoms (ie: fever, flu-like symptoms, shortness of breath, cough etc.) before then, please call [phone].  If you test positive for Covid 19 in the 2 weeks post procedure, please call and report this information to us.    If any biopsies were taken you will be contacted by phone or by letter within the next 1-3 weeks.  Please call us at [phone] if you have not heard about the biopsies in 3 weeks.    SIGNATURES/CONFIDENTIALITY: You and/or your care partner have signed paperwork which will be entered into your electronic medical record.  These signatures attest to the fact that that the information above on your After Visit Summary has been reviewed and is understood.  Full responsibility of the confidentiality of this discharge information lies with you and/or your care-partner.  

## 2021-09-10 NOTE — Op Note (Signed)
[place] Patient Name: Jacob Terrell Procedure Date: 09/10/2021 11:43 AM MRN: [id number] Endoscopist: Justice Britain , MD Age: 50 Referring MD:  Date of Birth: 03-19-1971 Gender: Male Account #: [id number] Procedure:                Colonoscopy Indications:              Screening for colorectal malignant neoplasm, This                            is the patient's first colonoscopy Medicines:                Monitored Anesthesia Care Procedure:                Pre-Anesthesia Assessment:                           - Prior to the procedure, a History and Physical                            was performed, and patient medications and                            allergies were reviewed. The patient's tolerance of                            previous anesthesia was also reviewed. The risks                            and benefits of the procedure and the sedation                            options and risks were discussed with the patient.                            All questions were answered, and informed consent                            was obtained. Prior Anticoagulants: The patient has                            taken no previous anticoagulant or antiplatelet                            agents. ASA Grade Assessment: II - A patient with                            mild systemic disease. After reviewing the risks                            and benefits, the patient was deemed in                            satisfactory condition to undergo the procedure.  After obtaining informed consent, the colonoscope                            was passed under direct vision. Throughout the                            procedure, the patient's blood pressure, pulse, and                            oxygen saturations were monitored continuously. The                            CF [id number] #[id number] was introduced through the anus                            and advanced to the 5  cm into the ileum. The                            colonoscopy was performed without difficulty. The                            patient tolerated the procedure. The quality of the                            bowel preparation was good. The terminal ileum,                            ileocecal valve, appendiceal orifice, and rectum                            were photographed. Scope In: 12:03:58 PM Scope Out: 12:14:13 PM Scope Withdrawal Time: 0 hours 7 minutes 50 seconds  Total Procedure Duration: 0 hours 10 minutes 15 seconds  Findings:                 The digital rectal exam findings include                            hemorrhoids. Pertinent negatives include no                            palpable rectal lesions.                           The terminal ileum and ileocecal valve appeared                            normal.                           Three sessile polyps were found in the rectum (2)                            and recto-sigmoid colon (1). The polyps were 2 to 8  mm in size. These polyps were removed with a cold                            snare. Resection and retrieval were complete.                           Normal mucosa was found in the entire colon                            otherwise.                           Non-bleeding non-thrombosed external and internal                            hemorrhoids were found during retroflexion, during                            perianal exam and during digital exam. The                            hemorrhoids were Grade II (internal hemorrhoids                            that prolapse but reduce spontaneously). Complications:            No immediate complications. Estimated Blood Loss:     Estimated blood loss: none. Estimated blood loss                            was minimal. Impression:               - Hemorrhoids found on digital rectal exam.                           - The examined portion of the ileum was  normal.                           - Three 2 to 8 mm polyps in the rectum and at the                            recto-sigmoid colon, removed with a cold snare.                            Resected and retrieved.                           - Normal mucosa in the entire examined colon                            otherwise.                           - Non-bleeding non-thrombosed external and internal  hemorrhoids. Recommendation:           - The patient will be observed post-procedure,                            until all discharge criteria are met.                           - Discharge patient to home.                           - Patient has a contact number available for                            emergencies. The signs and symptoms of potential                            delayed complications were discussed with the                            patient. Return to normal activities tomorrow.                            Written discharge instructions were provided to the                            patient.                           - High fiber diet.                           - Use FiberCon 1-2 tablets PO daily.                           - Continue present medications.                           - Await pathology results.                           - Repeat colonoscopy in 3/5/7 years for                            surveillance based on pathology results.                           - The findings and recommendations were discussed                            with the patient.                           - The findings and recommendations were discussed                            with the patient's family. Justice Britain, MD 09/10/2021 12:18:47 PM

## 2021-09-12 ENCOUNTER — Telehealth: Payer: Self-pay

## 2021-09-12 NOTE — Telephone Encounter (Signed)
Left message on answering machine. 

## 2021-09-15 ENCOUNTER — Encounter: Payer: Self-pay | Admitting: Gastroenterology

## 2021-09-23 DIAGNOSIS — H401131 Primary open-angle glaucoma, bilateral, mild stage: Secondary | ICD-10-CM | POA: Diagnosis not present

## 2021-12-03 NOTE — Progress Notes (Signed)
PATIENT: Jacob Terrell DOB: 1971-04-10  REASON FOR VISIT: follow up HISTORY FROM: patient  Chief Complaint  Patient presents with   Follow-up    Rm 2, alone. Here for yearly CPAP f/u. Pt reports doing well. No issues or concerns.     HISTORY OF PRESENT ILLNESS:  12/04/21 ALL:  Jacob Terrell is a 51 y.o. male here today for follow up for OSA on CPAP.  His download below shows 100% compliance and an AHI of 0.9. He states he loves his Cpap and has no concerns at this time. Patient does however express concerns about issues with incorrect billing with his DME, encouraged him to reach out to [place].    HISTORY: (copied from previous note) 11/28/20 ALL: Compliance report dated 10/28/2020 through 11/26/2020 reveals that he CPAP 30 of the past 30 days for compliance of 100%.  He is CPAP greater than 4 hours 28 of the past 30 days for compliance of 93%.  Average usage on days used was 6 hours and 11 minutes.  Residual AHI was 1.0 on 7 to 13 cm of water and an EPR of 3.  There was no significant leak noted.  REVIEW OF SYSTEMS: Out of a complete 14 system review of symptoms, the patient complains only of the following symptoms, none and all other reviewed systems are negative.  ESS: 3  ALLERGIES: Allergies  Allergen Reactions   Amoxicillin     HOME MEDICATIONS: Outpatient Medications Prior to Visit  Medication Sig Dispense Refill   cyclobenzaprine (FLEXERIL) 5 MG tablet Take 5 mg by mouth as needed.     latanoprost (XALATAN) 0.005 % ophthalmic solution Place 1 drop into both eyes at bedtime.     rosuvastatin (CRESTOR) 5 MG tablet Take 5 mg by mouth daily.     valsartan (DIOVAN) 80 MG tablet Take 80 mg by mouth daily.     fluconazole (DIFLUCAN) 150 MG tablet TAKE 1 TABLET BY MOUTH ONE TIME PER WEEK (Patient not taking: No sig reported) 4 tablet 1   No facility-administered medications prior to visit.    PAST MEDICAL HISTORY: Past Medical History:  Diagnosis Date   Glaucoma     Hyperlipidemia    Hypertension    Sleep apnea    cpap   Toenail fungus     PAST SURGICAL HISTORY: Past Surgical History:  Procedure Laterality Date   ARTHROSCOPIC REPAIR ACL     HERNIA REPAIR     INNER EAR SURGERY      FAMILY HISTORY: Family History  Problem Relation Age of Onset   Colon cancer Neg Hx    Colon polyps Neg Hx    Esophageal cancer Neg Hx    Rectal cancer Neg Hx    Stomach cancer Neg Hx     SOCIAL HISTORY: Social History   Socioeconomic History   Marital status: Married    Spouse name: Not on file   Number of children: Not on file   Years of education: Not on file   Highest education level: Not on file  Occupational History   Not on file  Tobacco Use   Smoking status: Former   Smokeless tobacco: Never  Vaping Use   Vaping Use: Every day  Substance and Sexual Activity   Alcohol use: Yes    Alcohol/week: 6.0 standard drinks    Types: 6 Standard drinks or equivalent per week   Drug use: Not Currently   Sexual activity: Not on file  Other Topics Concern  Not on file  Social History Narrative   Not on file   Social Determinants of Health   Financial Resource Strain: Not on file  Food Insecurity: Not on file  Transportation Needs: Not on file  Physical Activity: Not on file  Stress: Not on file  Social Connections: Not on file  Intimate Partner Violence: Not on file     PHYSICAL EXAM  Vitals:   12/04/21 1501  BP: 112/76  Pulse: 75  SpO2: 98%  Weight: 177 lb 8 oz (80.5 kg)  Height: 5\' 8"  (1.727 m)   Body mass index is 26.99 kg/m.  Generalized: Well developed, in no acute distress  Cardiology: normal rate and rhythm, no murmur noted Respiratory: clear to auscultation bilaterally  Neurological examination  Mentation: Alert oriented to time, place, history taking. Follows all commands speech and language fluent Cranial nerve II-XII: Pupils were equal round reactive to light. Extraocular movements were full, visual field were full   Motor: The motor testing reveals 5 over 5 strength of all 4 extremities. Good symmetric motor tone is noted throughout.  Gait and station: Gait is normal.    DIAGNOSTIC DATA (LABS, IMAGING, TESTING) - I reviewed patient records, labs, notes, testing and imaging myself where available.  No flowsheet data found.   Lab Results  Component Value Date   HGB 14.3 02/10/2011   HCT 42.0 02/10/2011      Component Value Date/Time   NA 140 02/10/2011 1325   K 4.6 02/10/2011 1325   CL 108 02/10/2011 1325   GLUCOSE 102 (H) 02/10/2011 1325   BUN 21 02/10/2011 1325   CREATININE 1.5 02/10/2011 1325   No results found for: CHOL, HDL, LDLCALC, LDLDIRECT, TRIG, CHOLHDL No results found for: HGBA1C No results found for: VITAMINB12 No results found for: TSH   ASSESSMENT AND PLAN 51 y.o. year old male  has a past medical history of Glaucoma, Hyperlipidemia, Hypertension, Sleep apnea, and Toenail fungus. here with     ICD-10-CM   1. OSA on CPAP  G47.33 For home use only DME continuous positive airway pressure (CPAP)   Z99.89         Jacob Terrell is doing well on CPAP therapy. Compliance report reveals 100%. He was encouraged to continue using CPAP nightly and for greater than 4 hours each night. Risks of untreated sleep apnea review and education materials provided. Healthy lifestyle habits encouraged. Advised him to speak with DME regarding billing issues. He will follow up in 1 year, sooner if needed. He verbalizes understanding and agreement with this plan.   Orders Placed This Encounter  Procedures   For home use only DME continuous positive airway pressure (CPAP)    Supplies    Order Specific Question:   Length of Need    Answer:   Lifetime    Order Specific Question:   Patient has OSA or probable OSA    Answer:   Yes    Order Specific Question:   Is the patient currently using CPAP in the home    Answer:   Yes    Order Specific Question:   Settings    Answer:   Other see  comments    Order Specific Question:   CPAP supplies needed    Answer:   Mask, headgear, cushions, filters, heated tubing and water chamber     No orders of the defined types were placed in this encounter.   Debbora Presto, FNP-C 12/04/2021, 4:21 PM [facility] [address] [place], [place] [address] [phone]

## 2021-12-04 ENCOUNTER — Encounter: Payer: Self-pay | Admitting: Family Medicine

## 2021-12-04 ENCOUNTER — Ambulatory Visit: Payer: BC Managed Care – PPO | Admitting: Family Medicine

## 2021-12-04 VITALS — BP 112/76 | HR 75 | Ht 68.0 in | Wt 177.5 lb

## 2021-12-04 DIAGNOSIS — Z9989 Dependence on other enabling machines and devices: Secondary | ICD-10-CM

## 2021-12-04 DIAGNOSIS — G4733 Obstructive sleep apnea (adult) (pediatric): Secondary | ICD-10-CM

## 2021-12-04 NOTE — Patient Instructions (Signed)
Below is our plan:  We will continue with our plan. We encourage you to continue using your Cpap every night and for greater than 4 hours.   Please make sure you are staying well hydrated. I recommend 50-60 ounces daily. Well balanced diet and regular exercise encouraged. Consistent sleep schedule with 6-8 hours recommended.   Please continue follow up with care team as directed.   Follow up with me in 1 year  You may receive a survey regarding today's visit. I encourage you to leave honest feed back as I do use this information to improve patient care. Thank you for seeing me today!

## 2021-12-25 DIAGNOSIS — J329 Chronic sinusitis, unspecified: Secondary | ICD-10-CM | POA: Diagnosis not present

## 2021-12-25 DIAGNOSIS — B9689 Other specified bacterial agents as the cause of diseases classified elsewhere: Secondary | ICD-10-CM | POA: Diagnosis not present

## 2021-12-25 DIAGNOSIS — J069 Acute upper respiratory infection, unspecified: Secondary | ICD-10-CM | POA: Diagnosis not present

## 2022-01-22 DIAGNOSIS — H524 Presbyopia: Secondary | ICD-10-CM | POA: Diagnosis not present

## 2022-01-22 DIAGNOSIS — H401131 Primary open-angle glaucoma, bilateral, mild stage: Secondary | ICD-10-CM | POA: Diagnosis not present

## 2022-02-04 DIAGNOSIS — E559 Vitamin D deficiency, unspecified: Secondary | ICD-10-CM | POA: Diagnosis not present

## 2022-02-04 DIAGNOSIS — I10 Essential (primary) hypertension: Secondary | ICD-10-CM | POA: Diagnosis not present

## 2022-02-04 DIAGNOSIS — E782 Mixed hyperlipidemia: Secondary | ICD-10-CM | POA: Diagnosis not present

## 2022-02-10 ENCOUNTER — Telehealth: Payer: Self-pay | Admitting: Gastroenterology

## 2022-02-10 DIAGNOSIS — I10 Essential (primary) hypertension: Secondary | ICD-10-CM | POA: Diagnosis not present

## 2022-02-10 DIAGNOSIS — E782 Mixed hyperlipidemia: Secondary | ICD-10-CM | POA: Diagnosis not present

## 2022-02-10 DIAGNOSIS — E559 Vitamin D deficiency, unspecified: Secondary | ICD-10-CM | POA: Diagnosis not present

## 2022-02-10 DIAGNOSIS — K635 Polyp of colon: Secondary | ICD-10-CM | POA: Diagnosis not present

## 2022-02-10 NOTE — Telephone Encounter (Signed)
Reports have been sent to Dr Ernie Hew as requested.  ?

## 2022-02-10 NOTE — Telephone Encounter (Signed)
Inbound call from Dr. Ernie Hew. Requesting she be sent a copy of colonoscopy and pathology report from 09/2021 procedure. Fax number [phone] ?

## 2022-05-05 DIAGNOSIS — J020 Streptococcal pharyngitis: Secondary | ICD-10-CM | POA: Diagnosis not present

## 2022-05-05 DIAGNOSIS — Z20818 Contact with and (suspected) exposure to other bacterial communicable diseases: Secondary | ICD-10-CM | POA: Diagnosis not present

## 2022-05-25 DIAGNOSIS — H02834 Dermatochalasis of left upper eyelid: Secondary | ICD-10-CM | POA: Diagnosis not present

## 2022-05-25 DIAGNOSIS — H401131 Primary open-angle glaucoma, bilateral, mild stage: Secondary | ICD-10-CM | POA: Diagnosis not present

## 2022-05-25 DIAGNOSIS — H02831 Dermatochalasis of right upper eyelid: Secondary | ICD-10-CM | POA: Diagnosis not present

## 2022-07-09 ENCOUNTER — Ambulatory Visit: Payer: BC Managed Care – PPO | Admitting: Podiatry

## 2022-07-09 DIAGNOSIS — L853 Xerosis cutis: Secondary | ICD-10-CM | POA: Diagnosis not present

## 2022-07-09 DIAGNOSIS — Z79899 Other long term (current) drug therapy: Secondary | ICD-10-CM | POA: Diagnosis not present

## 2022-07-09 DIAGNOSIS — B351 Tinea unguium: Secondary | ICD-10-CM

## 2022-07-09 DIAGNOSIS — R234 Changes in skin texture: Secondary | ICD-10-CM | POA: Diagnosis not present

## 2022-07-09 MED ORDER — FLUCONAZOLE 150 MG PO TABS: 150.0000 mg | ORAL_TABLET | ORAL | 0 refills | Status: AC

## 2022-07-12 NOTE — Progress Notes (Signed)
  Subjective:  Patient ID: Jacob Terrell, male    DOB: [birth year],  MRN: [id number]  Chief Complaint  Patient presents with   skin fissure    Bilateral heels sometimes crack in the winter and can be very painfulpainful   Nail Problem    Patient has several nails that are thick and discolored. He has taken Diflucan in the past and that worked well. He would like to try that again.    51 y.o. male presents with the above complaint. History confirmed with patient. Patient has several nails that are thick and discolored. He has taken Diflucan in the past and that worked well. He would like to try that again. Also has trouble with dry cracked skin in both heels and hoping to take care of before it gets more dry outside in winter.   Objective:  Physical Exam: warm, good capillary refill, nail exam onychomycosis of the toenails, noted to have several small skin fissures present bilateral feel, none are deep or open wounds, minimally tender to palpation. Xerosis of skin both feet and heels noted.  DP pulses palpable, PT pulses palpable, and protective sensation intact Left Foot: normal exam, no swelling, tenderness, instability; ligaments intact, full range of motion of all ankle/foot joints  Right Foot: normal exam, no swelling, tenderness, instability; ligaments intact, full range of motion of all ankle/foot joints   No images are attached to the encounter.  Assessment:   1. Onychomycosis of toenail   2. Long-term use of high-risk medication   3. Xerosis cutis   4. Fissure in skin of both feet      Plan:  Patient was evaluated and treated and all questions answered.  Skin fissures of both heels and Onychomycosis  Discussed the etiology and treatment options for tinea pedis.  Discussed topical and oral treatment.  Recommended oral antifungal treatment with diflucan 150 mg tablet 1x weekly for 12 weeks. This was per patient request vs terbinafine.  Also sent rx for hepatic function panel -  will call patient if any abnormality. This was sent to the patient's pharmacy.  Also discussed appropriate foot hygiene, use of antifungal spray such as Tinactin in shoes, as well as cleaning her foot surfaces such as showers and bathroom floors with bleach.  For skin fissures recommend pt to use O'keefes foot balm for heel fissure repair. Apply per product instructions. Follow up in 3 mo.    Return in about 3 months (around 10/08/2022).

## 2022-08-12 DIAGNOSIS — Z0000 Encounter for general adult medical examination without abnormal findings: Secondary | ICD-10-CM | POA: Diagnosis not present

## 2022-08-12 DIAGNOSIS — Z114 Encounter for screening for human immunodeficiency virus [HIV]: Secondary | ICD-10-CM | POA: Diagnosis not present

## 2022-08-12 DIAGNOSIS — E559 Vitamin D deficiency, unspecified: Secondary | ICD-10-CM | POA: Diagnosis not present

## 2022-08-12 DIAGNOSIS — Z13220 Encounter for screening for lipoid disorders: Secondary | ICD-10-CM | POA: Diagnosis not present

## 2022-08-18 DIAGNOSIS — H6121 Impacted cerumen, right ear: Secondary | ICD-10-CM | POA: Diagnosis not present

## 2022-08-18 DIAGNOSIS — Z0000 Encounter for general adult medical examination without abnormal findings: Secondary | ICD-10-CM | POA: Diagnosis not present

## 2022-08-18 DIAGNOSIS — E782 Mixed hyperlipidemia: Secondary | ICD-10-CM | POA: Diagnosis not present

## 2022-08-18 DIAGNOSIS — Z23 Encounter for immunization: Secondary | ICD-10-CM | POA: Diagnosis not present

## 2022-08-18 DIAGNOSIS — M545 Low back pain, unspecified: Secondary | ICD-10-CM | POA: Diagnosis not present

## 2022-08-18 DIAGNOSIS — G4733 Obstructive sleep apnea (adult) (pediatric): Secondary | ICD-10-CM | POA: Diagnosis not present

## 2022-08-18 DIAGNOSIS — I10 Essential (primary) hypertension: Secondary | ICD-10-CM | POA: Diagnosis not present

## 2022-08-18 DIAGNOSIS — E559 Vitamin D deficiency, unspecified: Secondary | ICD-10-CM | POA: Diagnosis not present

## 2022-09-07 DIAGNOSIS — M9906 Segmental and somatic dysfunction of lower extremity: Secondary | ICD-10-CM | POA: Diagnosis not present

## 2022-09-07 DIAGNOSIS — M9904 Segmental and somatic dysfunction of sacral region: Secondary | ICD-10-CM | POA: Diagnosis not present

## 2022-09-07 DIAGNOSIS — M9903 Segmental and somatic dysfunction of lumbar region: Secondary | ICD-10-CM | POA: Diagnosis not present

## 2022-09-07 DIAGNOSIS — M9905 Segmental and somatic dysfunction of pelvic region: Secondary | ICD-10-CM | POA: Diagnosis not present

## 2022-09-15 DIAGNOSIS — M9905 Segmental and somatic dysfunction of pelvic region: Secondary | ICD-10-CM | POA: Diagnosis not present

## 2022-09-15 DIAGNOSIS — M9904 Segmental and somatic dysfunction of sacral region: Secondary | ICD-10-CM | POA: Diagnosis not present

## 2022-09-15 DIAGNOSIS — M9903 Segmental and somatic dysfunction of lumbar region: Secondary | ICD-10-CM | POA: Diagnosis not present

## 2022-09-15 DIAGNOSIS — M9906 Segmental and somatic dysfunction of lower extremity: Secondary | ICD-10-CM | POA: Diagnosis not present

## 2022-09-25 DIAGNOSIS — H401131 Primary open-angle glaucoma, bilateral, mild stage: Secondary | ICD-10-CM | POA: Diagnosis not present

## 2022-09-28 DIAGNOSIS — Z79899 Other long term (current) drug therapy: Secondary | ICD-10-CM | POA: Diagnosis not present

## 2022-09-29 LAB — HEPATIC FUNCTION PANEL
AG Ratio: 2 (calc) (ref 1.0–2.5)
ALT: 25 U/L (ref 9–46)
AST: 19 U/L (ref 10–35)
Albumin: 4.7 g/dL (ref 3.6–5.1)
Alkaline phosphatase (APISO): 62 U/L (ref 35–144)
Bilirubin, Direct: 0.1 mg/dL (ref 0.0–0.2)
Globulin: 2.4 g/dL (calc) (ref 1.9–3.7)
Indirect Bilirubin: 0.5 mg/dL (calc) (ref 0.2–1.2)
Total Bilirubin: 0.6 mg/dL (ref 0.2–1.2)
Total Protein: 7.1 g/dL (ref 6.1–8.1)

## 2022-10-07 DIAGNOSIS — R569 Unspecified convulsions: Secondary | ICD-10-CM | POA: Diagnosis not present

## 2022-10-08 ENCOUNTER — Ambulatory Visit: Payer: BC Managed Care – PPO | Admitting: Podiatry

## 2022-10-08 DIAGNOSIS — R234 Changes in skin texture: Secondary | ICD-10-CM | POA: Diagnosis not present

## 2022-10-08 DIAGNOSIS — L853 Xerosis cutis: Secondary | ICD-10-CM

## 2022-10-08 DIAGNOSIS — B351 Tinea unguium: Secondary | ICD-10-CM | POA: Diagnosis not present

## 2022-10-08 DIAGNOSIS — Z79899 Other long term (current) drug therapy: Secondary | ICD-10-CM | POA: Diagnosis not present

## 2022-10-08 MED ORDER — CICLOPIROX 8 % EX SOLN: Freq: Every day | CUTANEOUS | 0 refills | Status: AC

## 2022-10-08 NOTE — Progress Notes (Signed)
  Subjective:  Patient ID: Jacob Terrell, male    DOB: [birth year],  MRN: [id number]  Chief Complaint  Patient presents with   Follow-up    Patient is here for left foot great toenail fungus.patient states that he has had labs done, he is out of the medication prescribed for the fungus.    51 y.o. male presents for follow-up of left foot great toe fungal infection.  He has been taking fluconazole 150 mg tablet once a week for the past 12 weeks.  He says that there has been some improvement in the nail especially in the proximal border of the nail on the left hallux.  Has not had any side effects related to the medication.  Recently had liver function study done which were reviewed and did not show elevation of his AST or ALT.  Past Medical History:  Diagnosis Date   Glaucoma    Hyperlipidemia    Hypertension    Sleep apnea    cpap   Toenail fungus     Allergies  Allergen Reactions   Amoxicillin     ROS: Negative except as per HPI above  Objective:  General: AAO x3, NAD  Dermatological: There is distal discoloration dystrophic growth nail splitting and subungual debris on the left hallux nail however the proximal half of the nail does appear with improved coloration clearing and decreased dystrophy.  Lesser toenails on both feet with no significant fungal infection.  No significant concern with regards to xerosis or fissuring of the heels.  Vascular:  Dorsalis Pedis artery and Posterior Tibial artery pedal pulses are 2/4 bilateral.  Capillary fill time < 3 sec to all digits.   Neruologic: Grossly intact via light touch bilateral. Protective threshold intact to all sites bilateral.   Musculoskeletal: No gross boney pedal deformities bilateral. No pain, crepitus, or limitation noted with foot and ankle range of motion bilateral. Muscular strength 5/5 in all groups tested bilateral.  Gait: Unassisted, Nonantalgic.   No images are attached to the encounter.  Assessment:   1.  Onychomycosis of toenail   2. Long-term use of high-risk medication   3. Xerosis cutis   4. Fissure in skin of both feet      Plan:  Patient was evaluated and treated and all questions answered.  #Onychomycosis of left hallux nail-improving with oral antifungal agent #Possible tinea pedis -improved -Recommend that the patient continue with topical antifungal applied to all toenails. -E- prescription for Penlac 8% solution sent to the patient's pharmacy apply daily to all nails at night -Recommend using antifungal sprays or lotions like Lotrimin for the bilateral heels to prevent recurrence of tinea pedis or skin fissuring. -We will consider proceeding with further oral antifungal agent in the future after next visit if he is still having trouble with nail fungal infection however I am hopeful that he will continue to improve with regards to the clarity of his hallux nails.  Return in about 3 months (around 01/07/2023).          Everitt Amber, DPM [facility]

## 2022-10-13 DIAGNOSIS — M9904 Segmental and somatic dysfunction of sacral region: Secondary | ICD-10-CM | POA: Diagnosis not present

## 2022-10-13 DIAGNOSIS — M9903 Segmental and somatic dysfunction of lumbar region: Secondary | ICD-10-CM | POA: Diagnosis not present

## 2022-10-13 DIAGNOSIS — M9906 Segmental and somatic dysfunction of lower extremity: Secondary | ICD-10-CM | POA: Diagnosis not present

## 2022-10-13 DIAGNOSIS — M9905 Segmental and somatic dysfunction of pelvic region: Secondary | ICD-10-CM | POA: Diagnosis not present

## 2022-11-17 DIAGNOSIS — M9906 Segmental and somatic dysfunction of lower extremity: Secondary | ICD-10-CM | POA: Diagnosis not present

## 2022-11-17 DIAGNOSIS — M9904 Segmental and somatic dysfunction of sacral region: Secondary | ICD-10-CM | POA: Diagnosis not present

## 2022-11-17 DIAGNOSIS — M9903 Segmental and somatic dysfunction of lumbar region: Secondary | ICD-10-CM | POA: Diagnosis not present

## 2022-11-17 DIAGNOSIS — M9905 Segmental and somatic dysfunction of pelvic region: Secondary | ICD-10-CM | POA: Diagnosis not present

## 2022-12-01 DIAGNOSIS — F329 Major depressive disorder, single episode, unspecified: Secondary | ICD-10-CM | POA: Diagnosis not present

## 2022-12-03 NOTE — Patient Instructions (Signed)

## 2022-12-03 NOTE — Progress Notes (Signed)
PATIENT: Jacob Terrell DOB: December 19, 1970  REASON FOR VISIT: follow up HISTORY FROM: patient  Chief Complaint  Patient presents with   Follow-up    Patient in room 2 here for CPAP follow up. Reports Cpap is doing well, uses nightly.    HISTORY OF PRESENT ILLNESS:  12/07/22 ALL:  Jacob Terrell returns for follow up for OSA on CPAP. He reports doing well on CPAP. He is using therapy every night for about 6-7 hours. He denies concerns with machine or supplies. He is due for replacement supplies. Currently using nasal pillow. He has noted a leak and knows it is time to replace.     12/04/2021 ALL: Jacob Terrell is a 52 y.o. male here today for follow up for OSA on CPAP.  His download below shows 100% compliance and an AHI of 0.9. He states he loves his Cpap and has no concerns at this time. Patient does however express concerns about issues with incorrect billing with his DME, encouraged him to reach out to [facility].     11/28/20 ALL: Compliance report dated 10/28/2020 through 11/26/2020 reveals that he CPAP 30 of the past 30 days for compliance of 100%.  He is CPAP greater than 4 hours 28 of the past 30 days for compliance of 93%.  Average usage on days used was 6 hours and 11 minutes.  Residual AHI was 1.0 on 7 to 13 cm of water and an EPR of 3.  There was no significant leak noted.  REVIEW OF SYSTEMS: Out of a complete 14 system review of symptoms, the patient complains only of the following symptoms, none and all other reviewed systems are negative.  ESS: 3  ALLERGIES: Allergies  Allergen Reactions   Amoxicillin     HOME MEDICATIONS: Outpatient Medications Prior to Visit  Medication Sig Dispense Refill   ciclopirox (PENLAC) 8 % solution Apply topically at bedtime. Apply over nail and surrounding skin. Apply daily over previous coat. After seven (7) days, may remove with alcohol and continue cycle. 6.6 mL 0   cyclobenzaprine (FLEXERIL) 5 MG tablet Take 5 mg by mouth as needed.      latanoprost (XALATAN) 0.005 % ophthalmic solution Place 1 drop into both eyes at bedtime.     rosuvastatin (CRESTOR) 5 MG tablet Take 5 mg by mouth daily.     valsartan (DIOVAN) 80 MG tablet Take 80 mg by mouth daily.     fluconazole (DIFLUCAN) 150 MG tablet Take 1 tablet (150 mg total) by mouth once a week. (Patient not taking: Reported on 12/07/2022) 12 tablet 0   No facility-administered medications prior to visit.    PAST MEDICAL HISTORY: Past Medical History:  Diagnosis Date   Glaucoma    Hyperlipidemia    Hypertension    Sleep apnea    cpap   Toenail fungus     PAST SURGICAL HISTORY: Past Surgical History:  Procedure Laterality Date   ARTHROSCOPIC REPAIR ACL     HERNIA REPAIR     INNER EAR SURGERY      FAMILY HISTORY: Family History  Problem Relation Age of Onset   Colon cancer Neg Hx    Colon polyps Neg Hx    Esophageal cancer Neg Hx    Rectal cancer Neg Hx    Stomach cancer Neg Hx     SOCIAL HISTORY: Social History   Socioeconomic History   Marital status: Married    Spouse name: Not on file   Number of children: Not  on file   Years of education: Not on file   Highest education level: Not on file  Occupational History   Not on file  Tobacco Use   Smoking status: Former   Smokeless tobacco: Never  Vaping Use   Vaping Use: Every day  Substance and Sexual Activity   Alcohol use: Yes    Alcohol/week: 6.0 standard drinks of alcohol    Types: 6 Standard drinks or equivalent per week   Drug use: Not Currently   Sexual activity: Not on file  Other Topics Concern   Not on file  Social History Narrative   Not on file   Social Determinants of Health   Financial Resource Strain: Not on file  Food Insecurity: Not on file  Transportation Needs: Not on file  Physical Activity: Not on file  Stress: Not on file  Social Connections: Not on file  Intimate Partner Violence: Not on file     PHYSICAL EXAM  Vitals:   12/07/22 1432  BP: 122/79  Pulse:  80  Weight: 179 lb 9.6 oz (81.5 kg)  Height: '5\' 8"'$  (1.727 m)    Body mass index is 27.31 kg/m.  Generalized: Well developed, in no acute distress  Cardiology: normal rate and rhythm, no murmur noted Respiratory: clear to auscultation bilaterally  Neurological examination  Mentation: Alert oriented to time, place, history taking. Follows all commands speech and language fluent Cranial nerve II-XII: Pupils were equal round reactive to light. Extraocular movements were full, visual field were full  Motor: The motor testing reveals 5 over 5 strength of all 4 extremities. Good symmetric motor tone is noted throughout.  Gait and station: Gait is normal.    DIAGNOSTIC DATA (LABS, IMAGING, TESTING) - I reviewed patient records, labs, notes, testing and imaging myself where available.      No data to display           Lab Results  Component Value Date   HGB 14.3 02/10/2011   HCT 42.0 02/10/2011      Component Value Date/Time   NA 140 02/10/2011 1325   K 4.6 02/10/2011 1325   CL 108 02/10/2011 1325   GLUCOSE 102 (H) 02/10/2011 1325   BUN 21 02/10/2011 1325   CREATININE 1.5 02/10/2011 1325   PROT 7.1 09/28/2022 1035   AST 19 09/28/2022 1035   ALT 25 09/28/2022 1035   BILITOT 0.6 09/28/2022 1035   No results found for: "CHOL", "HDL", "LDLCALC", "LDLDIRECT", "TRIG", "CHOLHDL" No results found for: "HGBA1C" No results found for: "VITAMINB12" No results found for: "TSH"   ASSESSMENT AND PLAN 52 y.o. year old male  has a past medical history of Glaucoma, Hyperlipidemia, Hypertension, Sleep apnea, and Toenail fungus. here with     ICD-10-CM   1. OSA on CPAP  G47.33 For home use only DME continuous positive airway pressure (CPAP)        Jacob Terrell is doing well on CPAP therapy. Compliance report reveals 100%. He was encouraged to continue using CPAP nightly and for greater than 4 hours each night. Risks of untreated sleep apnea review and education materials provided.  Healthy lifestyle habits encouraged.Supply orders placed. He will follow up in 1 year, sooner if needed. He verbalizes understanding and agreement with this plan.   Orders Placed This Encounter  Procedures   For home use only DME continuous positive airway pressure (CPAP)    Supplies    Order Specific Question:   Length of Need    Answer:  Lifetime    Order Specific Question:   Patient has OSA or probable OSA    Answer:   Yes    Order Specific Question:   Is the patient currently using CPAP in the home    Answer:   Yes    Order Specific Question:   Settings    Answer:   Other see comments    Order Specific Question:   CPAP supplies needed    Answer:   Mask, headgear, cushions, filters, heated tubing and water chamber     No orders of the defined types were placed in this encounter.   Debbora Presto, FNP-C 12/07/2022, 3:38 PM [facility] [address] [place], [place] [address] [phone]

## 2022-12-07 ENCOUNTER — Encounter: Payer: Self-pay | Admitting: Family Medicine

## 2022-12-07 ENCOUNTER — Telehealth: Payer: Self-pay | Admitting: Family Medicine

## 2022-12-07 ENCOUNTER — Telehealth: Payer: Self-pay

## 2022-12-07 ENCOUNTER — Ambulatory Visit: Payer: BC Managed Care – PPO | Admitting: Family Medicine

## 2022-12-07 VITALS — BP 122/79 | HR 80 | Ht 68.0 in | Wt 179.6 lb

## 2022-12-07 DIAGNOSIS — G4733 Obstructive sleep apnea (adult) (pediatric): Secondary | ICD-10-CM

## 2022-12-07 NOTE — Telephone Encounter (Signed)
Called pt and let him know that his Supply Order for his CPAP has been sent in. Pt verbalized understanding.

## 2022-12-07 NOTE — Telephone Encounter (Signed)
Pt is calling. Stated he needs a prescription sent to [facility] so he can get his CPAP supplies.

## 2022-12-09 DIAGNOSIS — Q909 Down syndrome, unspecified: Secondary | ICD-10-CM | POA: Diagnosis not present

## 2022-12-09 DIAGNOSIS — Z931 Gastrostomy status: Secondary | ICD-10-CM | POA: Diagnosis not present

## 2022-12-09 DIAGNOSIS — R1312 Dysphagia, oropharyngeal phase: Secondary | ICD-10-CM | POA: Diagnosis not present

## 2022-12-15 DIAGNOSIS — M9906 Segmental and somatic dysfunction of lower extremity: Secondary | ICD-10-CM | POA: Diagnosis not present

## 2022-12-15 DIAGNOSIS — M9904 Segmental and somatic dysfunction of sacral region: Secondary | ICD-10-CM | POA: Diagnosis not present

## 2022-12-15 DIAGNOSIS — M9903 Segmental and somatic dysfunction of lumbar region: Secondary | ICD-10-CM | POA: Diagnosis not present

## 2022-12-15 DIAGNOSIS — G4733 Obstructive sleep apnea (adult) (pediatric): Secondary | ICD-10-CM | POA: Diagnosis not present

## 2022-12-15 DIAGNOSIS — M9905 Segmental and somatic dysfunction of pelvic region: Secondary | ICD-10-CM | POA: Diagnosis not present

## 2022-12-21 DIAGNOSIS — F329 Major depressive disorder, single episode, unspecified: Secondary | ICD-10-CM | POA: Diagnosis not present

## 2022-12-29 DIAGNOSIS — F329 Major depressive disorder, single episode, unspecified: Secondary | ICD-10-CM | POA: Diagnosis not present

## 2023-01-07 ENCOUNTER — Ambulatory Visit: Payer: BC Managed Care – PPO | Admitting: Podiatry

## 2023-01-07 DIAGNOSIS — Z79899 Other long term (current) drug therapy: Secondary | ICD-10-CM | POA: Diagnosis not present

## 2023-01-07 DIAGNOSIS — B351 Tinea unguium: Secondary | ICD-10-CM

## 2023-01-07 NOTE — Progress Notes (Signed)
  Subjective:  Patient ID: Jacob Terrell, male    DOB: Mar 15, 1971,  MRN: [id number]  Chief Complaint  Patient presents with   Follow-up    Left great toenail follow up. Feels like his toenail is making some progress.     52 y.o. male presents for follow-up of left foot great toe fungal infection.  He has been using topical Penlac 8% solution for nail fungus especially the left hallux nail.  He feels that the nail is improving with decreased fungal dystrophy and discoloration.  Past Medical History:  Diagnosis Date   Glaucoma    Hyperlipidemia    Hypertension    Sleep apnea    cpap   Toenail fungus     Allergies  Allergen Reactions   Amoxicillin     ROS: Negative except as per HPI above  Objective:  General: AAO x3, NAD  Dermatological: Decreased fungal dystrophy in the left hallux nail.  Appears much clearer than prior.  Nontender to palpation.  Vascular:  Dorsalis Pedis artery and Posterior Tibial artery pedal pulses are 2/4 bilateral.  Capillary fill time < 3 sec to all digits.   Neruologic: Grossly intact via light touch bilateral. Protective threshold intact to all sites bilateral.   Musculoskeletal: No gross boney pedal deformities bilateral. No pain, crepitus, or limitation noted with foot and ankle range of motion bilateral. Muscular strength 5/5 in all groups tested bilateral.  Gait: Unassisted, Nonantalgic.   No images are attached to the encounter.  Assessment:   1. Onychomycosis of toenail   2. Long-term use of high-risk medication       Plan:  Patient was evaluated and treated and all questions answered.  #Onychomycosis of left hallux nail-status post oral and topical antifungal agent #Possible tinea pedis -improved -Recommend that the patient continue with topical antifungal applied to all toenails. -Continue Penlac 8% solution topically daily -Recommend using antifungal sprays or lotions like Lotrimin for the bilateral heels to prevent  recurrence of tinea pedis or skin fissuring. -If nail worsens or he develops pain in the nail patient will follow-up  Return if symptoms worsen or fail to improve.          Everitt Amber, DPM [facility]

## 2023-01-12 DIAGNOSIS — M9904 Segmental and somatic dysfunction of sacral region: Secondary | ICD-10-CM | POA: Diagnosis not present

## 2023-01-12 DIAGNOSIS — M9903 Segmental and somatic dysfunction of lumbar region: Secondary | ICD-10-CM | POA: Diagnosis not present

## 2023-01-12 DIAGNOSIS — M9906 Segmental and somatic dysfunction of lower extremity: Secondary | ICD-10-CM | POA: Diagnosis not present

## 2023-01-12 DIAGNOSIS — F329 Major depressive disorder, single episode, unspecified: Secondary | ICD-10-CM | POA: Diagnosis not present

## 2023-01-12 DIAGNOSIS — M9905 Segmental and somatic dysfunction of pelvic region: Secondary | ICD-10-CM | POA: Diagnosis not present

## 2023-01-13 DIAGNOSIS — G4733 Obstructive sleep apnea (adult) (pediatric): Secondary | ICD-10-CM | POA: Diagnosis not present

## 2023-01-26 DIAGNOSIS — F329 Major depressive disorder, single episode, unspecified: Secondary | ICD-10-CM | POA: Diagnosis not present

## 2023-01-29 DIAGNOSIS — H401131 Primary open-angle glaucoma, bilateral, mild stage: Secondary | ICD-10-CM | POA: Diagnosis not present

## 2023-02-08 DIAGNOSIS — M9905 Segmental and somatic dysfunction of pelvic region: Secondary | ICD-10-CM | POA: Diagnosis not present

## 2023-02-08 DIAGNOSIS — M9906 Segmental and somatic dysfunction of lower extremity: Secondary | ICD-10-CM | POA: Diagnosis not present

## 2023-02-08 DIAGNOSIS — M9903 Segmental and somatic dysfunction of lumbar region: Secondary | ICD-10-CM | POA: Diagnosis not present

## 2023-02-08 DIAGNOSIS — M9904 Segmental and somatic dysfunction of sacral region: Secondary | ICD-10-CM | POA: Diagnosis not present

## 2023-02-09 DIAGNOSIS — F329 Major depressive disorder, single episode, unspecified: Secondary | ICD-10-CM | POA: Diagnosis not present

## 2023-02-10 DIAGNOSIS — E559 Vitamin D deficiency, unspecified: Secondary | ICD-10-CM | POA: Diagnosis not present

## 2023-02-10 DIAGNOSIS — E782 Mixed hyperlipidemia: Secondary | ICD-10-CM | POA: Diagnosis not present

## 2023-02-13 DIAGNOSIS — G4733 Obstructive sleep apnea (adult) (pediatric): Secondary | ICD-10-CM | POA: Diagnosis not present

## 2023-02-17 DIAGNOSIS — K4090 Unilateral inguinal hernia, without obstruction or gangrene, not specified as recurrent: Secondary | ICD-10-CM | POA: Diagnosis not present

## 2023-02-17 DIAGNOSIS — E559 Vitamin D deficiency, unspecified: Secondary | ICD-10-CM | POA: Diagnosis not present

## 2023-02-17 DIAGNOSIS — E782 Mixed hyperlipidemia: Secondary | ICD-10-CM | POA: Diagnosis not present

## 2023-02-17 DIAGNOSIS — I10 Essential (primary) hypertension: Secondary | ICD-10-CM | POA: Diagnosis not present

## 2023-02-25 DIAGNOSIS — F329 Major depressive disorder, single episode, unspecified: Secondary | ICD-10-CM | POA: Diagnosis not present

## 2023-03-08 DIAGNOSIS — M9906 Segmental and somatic dysfunction of lower extremity: Secondary | ICD-10-CM | POA: Diagnosis not present

## 2023-03-08 DIAGNOSIS — M9905 Segmental and somatic dysfunction of pelvic region: Secondary | ICD-10-CM | POA: Diagnosis not present

## 2023-03-08 DIAGNOSIS — M9903 Segmental and somatic dysfunction of lumbar region: Secondary | ICD-10-CM | POA: Diagnosis not present

## 2023-03-08 DIAGNOSIS — M9904 Segmental and somatic dysfunction of sacral region: Secondary | ICD-10-CM | POA: Diagnosis not present

## 2023-03-09 DIAGNOSIS — F329 Major depressive disorder, single episode, unspecified: Secondary | ICD-10-CM | POA: Diagnosis not present

## 2023-03-09 DIAGNOSIS — K4091 Unilateral inguinal hernia, without obstruction or gangrene, recurrent: Secondary | ICD-10-CM | POA: Diagnosis not present

## 2023-04-06 DIAGNOSIS — M9905 Segmental and somatic dysfunction of pelvic region: Secondary | ICD-10-CM | POA: Diagnosis not present

## 2023-04-06 DIAGNOSIS — M9904 Segmental and somatic dysfunction of sacral region: Secondary | ICD-10-CM | POA: Diagnosis not present

## 2023-04-06 DIAGNOSIS — M9903 Segmental and somatic dysfunction of lumbar region: Secondary | ICD-10-CM | POA: Diagnosis not present

## 2023-04-06 DIAGNOSIS — M9906 Segmental and somatic dysfunction of lower extremity: Secondary | ICD-10-CM | POA: Diagnosis not present

## 2023-04-08 DIAGNOSIS — F329 Major depressive disorder, single episode, unspecified: Secondary | ICD-10-CM | POA: Diagnosis not present

## 2023-05-04 DIAGNOSIS — M9903 Segmental and somatic dysfunction of lumbar region: Secondary | ICD-10-CM | POA: Diagnosis not present

## 2023-05-04 DIAGNOSIS — M9905 Segmental and somatic dysfunction of pelvic region: Secondary | ICD-10-CM | POA: Diagnosis not present

## 2023-05-04 DIAGNOSIS — M9904 Segmental and somatic dysfunction of sacral region: Secondary | ICD-10-CM | POA: Diagnosis not present

## 2023-05-04 DIAGNOSIS — M9906 Segmental and somatic dysfunction of lower extremity: Secondary | ICD-10-CM | POA: Diagnosis not present

## 2023-05-20 DIAGNOSIS — E663 Overweight: Secondary | ICD-10-CM | POA: Diagnosis not present

## 2023-05-20 DIAGNOSIS — K635 Polyp of colon: Secondary | ICD-10-CM | POA: Diagnosis not present

## 2023-05-20 DIAGNOSIS — K449 Diaphragmatic hernia without obstruction or gangrene: Secondary | ICD-10-CM | POA: Diagnosis not present

## 2023-05-20 DIAGNOSIS — I10 Essential (primary) hypertension: Secondary | ICD-10-CM | POA: Diagnosis not present

## 2023-06-03 DIAGNOSIS — M9906 Segmental and somatic dysfunction of lower extremity: Secondary | ICD-10-CM | POA: Diagnosis not present

## 2023-06-03 DIAGNOSIS — M9905 Segmental and somatic dysfunction of pelvic region: Secondary | ICD-10-CM | POA: Diagnosis not present

## 2023-06-03 DIAGNOSIS — M9904 Segmental and somatic dysfunction of sacral region: Secondary | ICD-10-CM | POA: Diagnosis not present

## 2023-06-03 DIAGNOSIS — M9903 Segmental and somatic dysfunction of lumbar region: Secondary | ICD-10-CM | POA: Diagnosis not present

## 2023-07-06 DIAGNOSIS — M9905 Segmental and somatic dysfunction of pelvic region: Secondary | ICD-10-CM | POA: Diagnosis not present

## 2023-07-06 DIAGNOSIS — M9904 Segmental and somatic dysfunction of sacral region: Secondary | ICD-10-CM | POA: Diagnosis not present

## 2023-07-06 DIAGNOSIS — M9903 Segmental and somatic dysfunction of lumbar region: Secondary | ICD-10-CM | POA: Diagnosis not present

## 2023-07-06 DIAGNOSIS — M9906 Segmental and somatic dysfunction of lower extremity: Secondary | ICD-10-CM | POA: Diagnosis not present

## 2023-08-05 DIAGNOSIS — H401131 Primary open-angle glaucoma, bilateral, mild stage: Secondary | ICD-10-CM | POA: Diagnosis not present

## 2023-08-10 DIAGNOSIS — M9903 Segmental and somatic dysfunction of lumbar region: Secondary | ICD-10-CM | POA: Diagnosis not present

## 2023-08-10 DIAGNOSIS — M9905 Segmental and somatic dysfunction of pelvic region: Secondary | ICD-10-CM | POA: Diagnosis not present

## 2023-08-10 DIAGNOSIS — M9904 Segmental and somatic dysfunction of sacral region: Secondary | ICD-10-CM | POA: Diagnosis not present

## 2023-08-10 DIAGNOSIS — M9906 Segmental and somatic dysfunction of lower extremity: Secondary | ICD-10-CM | POA: Diagnosis not present

## 2023-08-16 DIAGNOSIS — I10 Essential (primary) hypertension: Secondary | ICD-10-CM | POA: Diagnosis not present

## 2023-08-16 DIAGNOSIS — Z13220 Encounter for screening for lipoid disorders: Secondary | ICD-10-CM | POA: Diagnosis not present

## 2023-08-16 DIAGNOSIS — Z125 Encounter for screening for malignant neoplasm of prostate: Secondary | ICD-10-CM | POA: Diagnosis not present

## 2023-08-16 DIAGNOSIS — E782 Mixed hyperlipidemia: Secondary | ICD-10-CM | POA: Diagnosis not present

## 2023-08-26 DIAGNOSIS — Z23 Encounter for immunization: Secondary | ICD-10-CM | POA: Diagnosis not present

## 2023-08-26 DIAGNOSIS — H6121 Impacted cerumen, right ear: Secondary | ICD-10-CM | POA: Diagnosis not present

## 2023-08-26 DIAGNOSIS — Z0000 Encounter for general adult medical examination without abnormal findings: Secondary | ICD-10-CM | POA: Diagnosis not present

## 2023-08-26 DIAGNOSIS — H60501 Unspecified acute noninfective otitis externa, right ear: Secondary | ICD-10-CM | POA: Diagnosis not present

## 2023-09-14 DIAGNOSIS — M9905 Segmental and somatic dysfunction of pelvic region: Secondary | ICD-10-CM | POA: Diagnosis not present

## 2023-09-14 DIAGNOSIS — M9903 Segmental and somatic dysfunction of lumbar region: Secondary | ICD-10-CM | POA: Diagnosis not present

## 2023-09-14 DIAGNOSIS — M9906 Segmental and somatic dysfunction of lower extremity: Secondary | ICD-10-CM | POA: Diagnosis not present

## 2023-09-14 DIAGNOSIS — M9904 Segmental and somatic dysfunction of sacral region: Secondary | ICD-10-CM | POA: Diagnosis not present

## 2023-09-28 DIAGNOSIS — M9906 Segmental and somatic dysfunction of lower extremity: Secondary | ICD-10-CM | POA: Diagnosis not present

## 2023-09-28 DIAGNOSIS — M9904 Segmental and somatic dysfunction of sacral region: Secondary | ICD-10-CM | POA: Diagnosis not present

## 2023-09-28 DIAGNOSIS — M9903 Segmental and somatic dysfunction of lumbar region: Secondary | ICD-10-CM | POA: Diagnosis not present

## 2023-09-28 DIAGNOSIS — M9905 Segmental and somatic dysfunction of pelvic region: Secondary | ICD-10-CM | POA: Diagnosis not present

## 2023-11-16 DIAGNOSIS — M9904 Segmental and somatic dysfunction of sacral region: Secondary | ICD-10-CM | POA: Diagnosis not present

## 2023-11-16 DIAGNOSIS — M9903 Segmental and somatic dysfunction of lumbar region: Secondary | ICD-10-CM | POA: Diagnosis not present

## 2023-11-16 DIAGNOSIS — M9906 Segmental and somatic dysfunction of lower extremity: Secondary | ICD-10-CM | POA: Diagnosis not present

## 2023-11-16 DIAGNOSIS — M9905 Segmental and somatic dysfunction of pelvic region: Secondary | ICD-10-CM | POA: Diagnosis not present

## 2023-11-19 DIAGNOSIS — Z1159 Encounter for screening for other viral diseases: Secondary | ICD-10-CM | POA: Diagnosis not present

## 2023-11-19 DIAGNOSIS — J069 Acute upper respiratory infection, unspecified: Secondary | ICD-10-CM | POA: Diagnosis not present

## 2023-12-07 NOTE — Progress Notes (Deleted)
Marland Kitchen

## 2023-12-08 ENCOUNTER — Telehealth: Payer: Self-pay | Admitting: Family Medicine

## 2023-12-08 ENCOUNTER — Ambulatory Visit: Payer: BC Managed Care – PPO | Admitting: Family Medicine

## 2023-12-08 DIAGNOSIS — J209 Acute bronchitis, unspecified: Secondary | ICD-10-CM | POA: Diagnosis not present

## 2023-12-08 NOTE — Telephone Encounter (Signed)
Rescheduled appointment for 03/13/24

## 2023-12-08 NOTE — Telephone Encounter (Signed)
Pt cancelling appt due to have the flu. Transferred pt to Billing

## 2023-12-21 DIAGNOSIS — M9904 Segmental and somatic dysfunction of sacral region: Secondary | ICD-10-CM | POA: Diagnosis not present

## 2023-12-21 DIAGNOSIS — M9903 Segmental and somatic dysfunction of lumbar region: Secondary | ICD-10-CM | POA: Diagnosis not present

## 2023-12-21 DIAGNOSIS — M9906 Segmental and somatic dysfunction of lower extremity: Secondary | ICD-10-CM | POA: Diagnosis not present

## 2023-12-21 DIAGNOSIS — M9905 Segmental and somatic dysfunction of pelvic region: Secondary | ICD-10-CM | POA: Diagnosis not present

## 2024-01-17 DIAGNOSIS — M9903 Segmental and somatic dysfunction of lumbar region: Secondary | ICD-10-CM | POA: Diagnosis not present

## 2024-01-17 DIAGNOSIS — M9905 Segmental and somatic dysfunction of pelvic region: Secondary | ICD-10-CM | POA: Diagnosis not present

## 2024-01-17 DIAGNOSIS — M9906 Segmental and somatic dysfunction of lower extremity: Secondary | ICD-10-CM | POA: Diagnosis not present

## 2024-01-17 DIAGNOSIS — M9904 Segmental and somatic dysfunction of sacral region: Secondary | ICD-10-CM | POA: Diagnosis not present

## 2024-01-31 DIAGNOSIS — H401131 Primary open-angle glaucoma, bilateral, mild stage: Secondary | ICD-10-CM | POA: Diagnosis not present

## 2024-02-14 DIAGNOSIS — E559 Vitamin D deficiency, unspecified: Secondary | ICD-10-CM | POA: Diagnosis not present

## 2024-02-14 DIAGNOSIS — R5383 Other fatigue: Secondary | ICD-10-CM | POA: Diagnosis not present

## 2024-02-21 DIAGNOSIS — M9905 Segmental and somatic dysfunction of pelvic region: Secondary | ICD-10-CM | POA: Diagnosis not present

## 2024-02-21 DIAGNOSIS — M9903 Segmental and somatic dysfunction of lumbar region: Secondary | ICD-10-CM | POA: Diagnosis not present

## 2024-02-21 DIAGNOSIS — M9906 Segmental and somatic dysfunction of lower extremity: Secondary | ICD-10-CM | POA: Diagnosis not present

## 2024-02-21 DIAGNOSIS — M9904 Segmental and somatic dysfunction of sacral region: Secondary | ICD-10-CM | POA: Diagnosis not present

## 2024-02-28 DIAGNOSIS — E559 Vitamin D deficiency, unspecified: Secondary | ICD-10-CM | POA: Diagnosis not present

## 2024-02-28 DIAGNOSIS — Z789 Other specified health status: Secondary | ICD-10-CM | POA: Diagnosis not present

## 2024-02-28 DIAGNOSIS — I10 Essential (primary) hypertension: Secondary | ICD-10-CM | POA: Diagnosis not present

## 2024-02-28 DIAGNOSIS — E782 Mixed hyperlipidemia: Secondary | ICD-10-CM | POA: Diagnosis not present

## 2024-02-28 DIAGNOSIS — Z23 Encounter for immunization: Secondary | ICD-10-CM | POA: Diagnosis not present

## 2024-03-09 NOTE — Progress Notes (Unsigned)
 PATIENT: Jacob Terrell DOB: 03-Oct-1971  REASON FOR VISIT: follow up HISTORY FROM: patient  No chief complaint on file.   HISTORY OF PRESENT ILLNESS:  03/09/24 ALL:  Jacob Terrell returns for follow up for OSA on CPAP.    12/07/2022 ALL: Jacob Terrell returns for follow up for OSA on CPAP. He reports doing well on CPAP. He is using therapy every night for about 6-7 hours. He denies concerns with machine or supplies. He is due for replacement supplies. Currently using nasal pillow. He has noted a leak and knows it is time to replace.     12/04/2021 ALL: Jacob Terrell is a 53 y.o. male here today for follow up for OSA on CPAP.  His download below shows 100% compliance and an AHI of 0.9. He states he loves his Cpap and has no concerns at this time. Patient does however express concerns about issues with incorrect billing with his DME, encouraged him to reach out to [facility].     11/28/20 ALL: Compliance report dated 10/28/2020 through 11/26/2020 reveals that he CPAP 30 of the past 30 days for compliance of 100%.  He is CPAP greater than 4 hours 28 of the past 30 days for compliance of 93%.  Average usage on days used was 6 hours and 11 minutes.  Residual AHI was 1.0 on 7 to 13 cm of water and an EPR of 3.  There was no significant leak noted.  REVIEW OF SYSTEMS: Out of a complete 14 system review of symptoms, the patient complains only of the following symptoms, none and all other reviewed systems are negative.  ESS: 3  ALLERGIES: Allergies  Allergen Reactions   Amoxicillin     HOME MEDICATIONS: Outpatient Medications Prior to Visit  Medication Sig Dispense Refill   ciclopirox  (PENLAC ) 8 % solution Apply topically at bedtime. Apply over nail and surrounding skin. Apply daily over previous coat. After seven (7) days, may remove with alcohol and continue cycle. 6.6 mL 0   cyclobenzaprine (FLEXERIL) 5 MG tablet Take 5 mg by mouth as needed.     fluconazole  (DIFLUCAN ) 150 MG tablet Take 1  tablet (150 mg total) by mouth once a week. (Patient not taking: Reported on 12/07/2022) 12 tablet 0   latanoprost (XALATAN) 0.005 % ophthalmic solution Place 1 drop into both eyes at bedtime.     rosuvastatin (CRESTOR) 5 MG tablet Take 5 mg by mouth daily.     valsartan (DIOVAN) 80 MG tablet Take 80 mg by mouth daily.     No facility-administered medications prior to visit.    PAST MEDICAL HISTORY: Past Medical History:  Diagnosis Date   Glaucoma    Hyperlipidemia    Hypertension    Sleep apnea    cpap   Toenail fungus     PAST SURGICAL HISTORY: Past Surgical History:  Procedure Laterality Date   ARTHROSCOPIC REPAIR ACL     HERNIA REPAIR     INNER EAR SURGERY      FAMILY HISTORY: Family History  Problem Relation Age of Onset   Colon cancer Neg Hx    Colon polyps Neg Hx    Esophageal cancer Neg Hx    Rectal cancer Neg Hx    Stomach cancer Neg Hx     SOCIAL HISTORY: Social History   Socioeconomic History   Marital status: Married    Spouse name: Not on file   Number of children: Not on file   Years of education: Not on file  Highest education level: Not on file  Occupational History   Not on file  Tobacco Use   Smoking status: Former   Smokeless tobacco: Never  Vaping Use   Vaping status: Every Day  Substance and Sexual Activity   Alcohol use: Yes    Alcohol/week: 6.0 standard drinks of alcohol    Types: 6 Standard drinks or equivalent per week   Drug use: Not Currently   Sexual activity: Not on file  Other Topics Concern   Not on file  Social History Narrative   Not on file   Social Drivers of Health   Financial Resource Strain: Not on file  Food Insecurity: Not on file  Transportation Needs: Not on file  Physical Activity: Not on file  Stress: Not on file  Social Connections: Not on file  Intimate Partner Violence: Not on file     PHYSICAL EXAM  There were no vitals filed for this visit.   There is no height or weight on file to  calculate BMI.  Generalized: Well developed, in no acute distress  Cardiology: normal rate and rhythm, no murmur noted Respiratory: clear to auscultation bilaterally  Neurological examination  Mentation: Alert oriented to time, place, history taking. Follows all commands speech and language fluent Cranial nerve II-XII: Pupils were equal round reactive to light. Extraocular movements were full, visual field were full  Motor: The motor testing reveals 5 over 5 strength of all 4 extremities. Good symmetric motor tone is noted throughout.  Gait and station: Gait is normal.    DIAGNOSTIC DATA (LABS, IMAGING, TESTING) - I reviewed patient records, labs, notes, testing and imaging myself where available.      No data to display           Lab Results  Component Value Date   HGB 14.3 02/10/2011   HCT 42.0 02/10/2011      Component Value Date/Time   NA 140 02/10/2011 1325   K 4.6 02/10/2011 1325   CL 108 02/10/2011 1325   GLUCOSE 102 (H) 02/10/2011 1325   BUN 21 02/10/2011 1325   CREATININE 1.5 02/10/2011 1325   PROT 7.1 09/28/2022 1035   AST 19 09/28/2022 1035   ALT 25 09/28/2022 1035   BILITOT 0.6 09/28/2022 1035   No results found for: "CHOL", "HDL", "LDLCALC", "LDLDIRECT", "TRIG", "CHOLHDL" No results found for: "HGBA1C" No results found for: "VITAMINB12" No results found for: "TSH"   ASSESSMENT AND PLAN 53 y.o. year old male  has a past medical history of Glaucoma, Hyperlipidemia, Hypertension, Sleep apnea, and Toenail fungus. here with   No diagnosis found.     Jacob Terrell is doing well on CPAP therapy. Compliance report reveals 100%. He was encouraged to continue using CPAP nightly and for greater than 4 hours each night. Risks of untreated sleep apnea review and education materials provided. Healthy lifestyle habits encouraged.Supply orders placed. He will follow up in 1 year, sooner if needed. He verbalizes understanding and agreement with this plan.   No  orders of the defined types were placed in this encounter.    No orders of the defined types were placed in this encounter.   Terrilyn Fick, FNP-C 03/09/2024, 8:55 AM [facility] [address] [place], [place] [address] [phone]

## 2024-03-09 NOTE — Patient Instructions (Incomplete)

## 2024-03-09 NOTE — Progress Notes (Unsigned)
 Jacob Terrell

## 2024-03-13 ENCOUNTER — Encounter: Payer: Self-pay | Admitting: Family Medicine

## 2024-03-13 ENCOUNTER — Ambulatory Visit: Payer: BC Managed Care – PPO | Admitting: Family Medicine

## 2024-03-13 VITALS — BP 122/76 | HR 74 | Ht 68.0 in | Wt 185.0 lb

## 2024-03-13 DIAGNOSIS — G4733 Obstructive sleep apnea (adult) (pediatric): Secondary | ICD-10-CM

## 2024-03-20 DIAGNOSIS — M9903 Segmental and somatic dysfunction of lumbar region: Secondary | ICD-10-CM | POA: Diagnosis not present

## 2024-03-20 DIAGNOSIS — M9904 Segmental and somatic dysfunction of sacral region: Secondary | ICD-10-CM | POA: Diagnosis not present

## 2024-03-20 DIAGNOSIS — M9906 Segmental and somatic dysfunction of lower extremity: Secondary | ICD-10-CM | POA: Diagnosis not present

## 2024-03-20 DIAGNOSIS — M9905 Segmental and somatic dysfunction of pelvic region: Secondary | ICD-10-CM | POA: Diagnosis not present

## 2024-04-13 DIAGNOSIS — G4733 Obstructive sleep apnea (adult) (pediatric): Secondary | ICD-10-CM | POA: Diagnosis not present

## 2024-04-18 DIAGNOSIS — T63301A Toxic effect of unspecified spider venom, accidental (unintentional), initial encounter: Secondary | ICD-10-CM | POA: Diagnosis not present

## 2024-04-18 DIAGNOSIS — M25472 Effusion, left ankle: Secondary | ICD-10-CM | POA: Diagnosis not present

## 2024-04-24 DIAGNOSIS — K5909 Other constipation: Secondary | ICD-10-CM | POA: Diagnosis not present

## 2024-05-01 DIAGNOSIS — M9906 Segmental and somatic dysfunction of lower extremity: Secondary | ICD-10-CM | POA: Diagnosis not present

## 2024-05-01 DIAGNOSIS — M9903 Segmental and somatic dysfunction of lumbar region: Secondary | ICD-10-CM | POA: Diagnosis not present

## 2024-05-01 DIAGNOSIS — M9905 Segmental and somatic dysfunction of pelvic region: Secondary | ICD-10-CM | POA: Diagnosis not present

## 2024-05-01 DIAGNOSIS — M9904 Segmental and somatic dysfunction of sacral region: Secondary | ICD-10-CM | POA: Diagnosis not present

## 2024-05-13 DIAGNOSIS — G4733 Obstructive sleep apnea (adult) (pediatric): Secondary | ICD-10-CM | POA: Diagnosis not present

## 2024-06-05 DIAGNOSIS — M9904 Segmental and somatic dysfunction of sacral region: Secondary | ICD-10-CM | POA: Diagnosis not present

## 2024-06-05 DIAGNOSIS — M9903 Segmental and somatic dysfunction of lumbar region: Secondary | ICD-10-CM | POA: Diagnosis not present

## 2024-06-05 DIAGNOSIS — M9905 Segmental and somatic dysfunction of pelvic region: Secondary | ICD-10-CM | POA: Diagnosis not present

## 2024-06-05 DIAGNOSIS — M9906 Segmental and somatic dysfunction of lower extremity: Secondary | ICD-10-CM | POA: Diagnosis not present

## 2024-06-13 DIAGNOSIS — G4733 Obstructive sleep apnea (adult) (pediatric): Secondary | ICD-10-CM | POA: Diagnosis not present

## 2024-07-11 ENCOUNTER — Other Ambulatory Visit: Payer: Self-pay | Admitting: Family Medicine

## 2024-07-11 DIAGNOSIS — G4733 Obstructive sleep apnea (adult) (pediatric): Secondary | ICD-10-CM

## 2024-07-27 DIAGNOSIS — H401131 Primary open-angle glaucoma, bilateral, mild stage: Secondary | ICD-10-CM | POA: Diagnosis not present

## 2024-07-27 DIAGNOSIS — H52203 Unspecified astigmatism, bilateral: Secondary | ICD-10-CM | POA: Diagnosis not present

## 2024-07-27 DIAGNOSIS — H5203 Hypermetropia, bilateral: Secondary | ICD-10-CM | POA: Diagnosis not present

## 2024-08-25 DIAGNOSIS — Z13220 Encounter for screening for lipoid disorders: Secondary | ICD-10-CM | POA: Diagnosis not present

## 2024-08-25 DIAGNOSIS — Z0000 Encounter for general adult medical examination without abnormal findings: Secondary | ICD-10-CM | POA: Diagnosis not present

## 2024-08-25 DIAGNOSIS — Z125 Encounter for screening for malignant neoplasm of prostate: Secondary | ICD-10-CM | POA: Diagnosis not present

## 2024-08-30 DIAGNOSIS — Z0000 Encounter for general adult medical examination without abnormal findings: Secondary | ICD-10-CM | POA: Diagnosis not present

## 2024-08-30 DIAGNOSIS — Z7185 Encounter for immunization safety counseling: Secondary | ICD-10-CM | POA: Diagnosis not present

## 2024-08-30 DIAGNOSIS — Z23 Encounter for immunization: Secondary | ICD-10-CM | POA: Diagnosis not present

## 2024-09-08 DIAGNOSIS — E559 Vitamin D deficiency, unspecified: Secondary | ICD-10-CM | POA: Diagnosis not present

## 2024-09-08 DIAGNOSIS — N529 Male erectile dysfunction, unspecified: Secondary | ICD-10-CM | POA: Diagnosis not present

## 2024-09-08 DIAGNOSIS — G4733 Obstructive sleep apnea (adult) (pediatric): Secondary | ICD-10-CM | POA: Diagnosis not present

## 2024-09-08 DIAGNOSIS — R6882 Decreased libido: Secondary | ICD-10-CM | POA: Diagnosis not present

## 2024-10-13 DIAGNOSIS — R059 Cough, unspecified: Secondary | ICD-10-CM | POA: Diagnosis not present

## 2024-10-13 DIAGNOSIS — Z03818 Encounter for observation for suspected exposure to other biological agents ruled out: Secondary | ICD-10-CM | POA: Diagnosis not present

## 2025-03-20 ENCOUNTER — Ambulatory Visit: Admitting: Family Medicine
